# Patient Record
Sex: Female | Born: 1980 | Race: White | Hispanic: No | Marital: Married | Smoking: Former smoker
Health system: Southern US, Community
[De-identification: ages and names within clinical notes are randomized; demographics above are authoritative.]

## PROBLEM LIST (undated history)

## (undated) ENCOUNTER — Inpatient Hospital Stay (HOSPITAL_COMMUNITY): Payer: Self-pay

## (undated) DIAGNOSIS — F3181 Bipolar II disorder: Secondary | ICD-10-CM

## (undated) DIAGNOSIS — G43909 Migraine, unspecified, not intractable, without status migrainosus: Secondary | ICD-10-CM

## (undated) DIAGNOSIS — Z87410 Personal history of cervical dysplasia: Secondary | ICD-10-CM

## (undated) DIAGNOSIS — Z8759 Personal history of other complications of pregnancy, childbirth and the puerperium: Secondary | ICD-10-CM

## (undated) DIAGNOSIS — R87629 Unspecified abnormal cytological findings in specimens from vagina: Secondary | ICD-10-CM

## (undated) DIAGNOSIS — E079 Disorder of thyroid, unspecified: Secondary | ICD-10-CM

## (undated) DIAGNOSIS — Z8669 Personal history of other diseases of the nervous system and sense organs: Secondary | ICD-10-CM

## (undated) DIAGNOSIS — K219 Gastro-esophageal reflux disease without esophagitis: Secondary | ICD-10-CM

## (undated) DIAGNOSIS — O139 Gestational [pregnancy-induced] hypertension without significant proteinuria, unspecified trimester: Secondary | ICD-10-CM

## (undated) DIAGNOSIS — H35389 Toxic maculopathy, unspecified eye: Secondary | ICD-10-CM

## (undated) DIAGNOSIS — Z8489 Family history of other specified conditions: Secondary | ICD-10-CM

## (undated) DIAGNOSIS — E039 Hypothyroidism, unspecified: Secondary | ICD-10-CM

## (undated) DIAGNOSIS — F39 Unspecified mood [affective] disorder: Secondary | ICD-10-CM

## (undated) DIAGNOSIS — Z973 Presence of spectacles and contact lenses: Secondary | ICD-10-CM

## (undated) DIAGNOSIS — K8020 Calculus of gallbladder without cholecystitis without obstruction: Secondary | ICD-10-CM

## (undated) DIAGNOSIS — E282 Polycystic ovarian syndrome: Secondary | ICD-10-CM

## (undated) DIAGNOSIS — R55 Syncope and collapse: Secondary | ICD-10-CM

## (undated) DIAGNOSIS — N946 Dysmenorrhea, unspecified: Secondary | ICD-10-CM

## (undated) DIAGNOSIS — F32A Depression, unspecified: Secondary | ICD-10-CM

## (undated) DIAGNOSIS — I10 Essential (primary) hypertension: Secondary | ICD-10-CM

## (undated) HISTORY — PX: APPENDECTOMY: SHX54

## (undated) HISTORY — DX: Toxic maculopathy, unspecified eye: H35.389

## (undated) HISTORY — DX: Unspecified mood (affective) disorder: F39

## (undated) HISTORY — DX: Depression, unspecified: F32.A

## (undated) HISTORY — DX: Essential (primary) hypertension: I10

## (undated) HISTORY — DX: Disorder of thyroid, unspecified: E07.9

## (undated) HISTORY — DX: Dysmenorrhea, unspecified: N94.6

## (undated) HISTORY — PX: WISDOM TOOTH EXTRACTION: SHX21

## (undated) HISTORY — PX: GYNECOLOGIC CRYOSURGERY: SHX857

## (undated) HISTORY — DX: Gestational (pregnancy-induced) hypertension without significant proteinuria, unspecified trimester: O13.9

## (undated) HISTORY — DX: Unspecified abnormal cytological findings in specimens from vagina: R87.629

## (undated) HISTORY — PX: TONSILLECTOMY AND ADENOIDECTOMY: SUR1326

## (undated) HISTORY — PX: COLPOSCOPY: SHX161

---
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
## 2003-07-16 ENCOUNTER — Other Ambulatory Visit: Admission: RE | Admit: 2003-07-16 | Discharge: 2003-07-16 | Payer: Self-pay | Admitting: Obstetrics and Gynecology

## 2004-04-30 ENCOUNTER — Observation Stay (HOSPITAL_COMMUNITY): Admission: EM | Admit: 2004-04-30 | Discharge: 2004-05-01 | Payer: Self-pay

## 2006-12-05 ENCOUNTER — Other Ambulatory Visit: Admission: RE | Admit: 2006-12-05 | Discharge: 2006-12-05 | Payer: Self-pay | Admitting: Gynecology

## 2010-06-25 NOTE — Op Note (Signed)
[place]. [facility]  Patient:    Melissa Whitney, Melissa Whitney                     MRN: [id number] Proc. Date: 08/15/00 Adm. Date:  [id number] Attending:  Fayette Pho Damodar CC:         Duncan Dull, M.D.   Operative Report  PREOPERATIVE DIAGNOSIS:  Acute appendicitis.  POSTOPERATIVE DIAGNOSIS:  Acute suppurative appendicitis without perforation.  OPERATION PERFORMED:  Exploratory laparotomy and appendectomy.  SURGEON:  Prabhakar D. Levie Heritage, M.D.  ASSISTANT:  Nurse.  ANESTHESIA:  Nurse.  INDICATIONS FOR PROCEDURE:  30 year old girl was admitted with about a 10-hour history of progressively worse intermittent colicky lower abdominal pain associated with bilious vomiting.  There was no history of URI, no dysuria, no abdominal trauma.  Physical examination was consistent with localizing lower quadrant tenderness.  Her white count was 19,200 with shift to the left. Urinalysis was negative.  A CT scan with IV and rectal contrast showed findings consistent with acute appendicitis.  Exploratory laparotomy was planned.  OPERATIVE FINDINGS:  Exploration of the right lower quadrant area showed appendix to be about four inches long with the distal half markedly distended with congestion of the serosa and omentum wrapped around this area consistent with acute suppurative appendicitis without gross perforation.  Examination of the distal ileum showed no evidence of ileitis or Meckels diverticulum.  DESCRIPTION OF PROCEDURE:  Under satisfactory general endotracheal anesthesia, with the patient in supine position, the abdomen was thoroughly prepped and draped in the usual manner.  Over the palpable fullness in the right lower quadrant area, about 4 to 5 cm long transverse incision was made.  The skin and subcutaneous tissues were incised.  Bleeders were individually clamped, cut and electrocoagulated.  Incision carried through the layers of the abdominal wall.  The  peritoneal cavity was entered.  The findings were as described above.  The cecum and appendix were exteriorized.  The appendicular mesentery was serially clamped, cut and ligated with 2-0 silk.  Appendectomy done in the routine fashion.  The stump was buried in the cecal wall with 3-0 silk pursestring suture.  Examination of the distal ileum was carried out. There was no evidence of ileitis or Meckels diverticulum.  The bowel was returned to the peritoneal cavity.  The area was irrigated with saline, sponge and needle count being correct, peritoneum and fascia closed with 2-0 Vicryl running interlocking sutures.  The muscle was closed with 2-0 Vicryl interrupted sutures, subcutaneous tissues with 2-0 Vicryl interrupted sutures. Skin closed with 4-0 Monocryl subcuticular sutures.  Steri-Strips applied. Appropriate dressing applied.  Throughout the procedure, the patients vital signs remained stable.  The patient withstood the procedure well and was transferred to the recovery room in satisfactory general condition. DD:  08/15/00 TD:  08/15/00 Job: [id number]

## 2010-06-25 NOTE — Procedures (Signed)
[place]. [facility]  Patient:    Melissa Whitney, Melissa Whitney                     MRN: [id number] Proc. Date: 04/03/00 Adm. Date:  [id number] Disc. Date: [id number] Attending:  Armanda Magic                           Procedure Report  PROCEDURE:  Tilt table test.  OPERATOR:  Armanda Magic, M.D.  INDICATION:  Syncope.  CLINICAL HISTORY:  This is a very pleasant 30 year old white female with multiple episodes of syncope in the past, who underwent initial tilt table testing, which was positive for vasovagal syncope with profound sinus pause with asystole for eight seconds.  The patient was placed on Paxil 20 mg a day and has had no further syncopal episodes in the past three weeks.  She now presents for repeat tilt table testing.  DESCRIPTION OF PROCEDURE:  The patient was brought to the electrophysiology laboratory in the fasting, nonsedated state.  Informed consent was obtained. The patient was connected to continuous heart rate and pulse oximetry monitoring and intermittent blood pressure monitoring.  The patient was placed in the supine position, and baseline blood pressure was obtained.  Her baseline blood pressure was 111/54 to 129/68.  Baseline heart rate was in the 60s.  The patient was then tilted upright 70 degrees for a total of 30 minutes.  Her lowest blood pressure was 113/85 with a heart rate in the 90s. There was one transient blood pressure of 78/38, but the patient was asymptomatic, and recheck was 113/74.  The patient was then placed supine, and her blood pressure again lowest was 111/70 with pulses in the 60s.  She was started on Isuprel drip, which was titrated to obtain a 20% increase in her resting heart rate.  She was then tilted back up at 70 degrees for a total of 15 minutes.  Her lowest blood pressure achieved was 119/70 with heart rates of 130s.  The patient was then tilted up to 70 degrees on Isuprel drip for a total of 30 minutes.  Lowest  blood pressure achieved was 92/57, and heart rates remained in the 130s-140s.  She had no symptoms.  At the end of the study, she was tilted back down supine, and then resting blood pressure was 134/79 with a heart rate of 112.  She was then transferred back to her room in stable condition.  IMPRESSION: 1. Syncope. 2. History of positive tilt table test with vasovagal syncope and prominent    sinus pauses with asystole for eight seconds. 3. Negative tilt table testing today on Paxil 20 mg a day.  PLAN: 1. Continue Paxil at the current dose. 2. I have instructed her she still cannot drive for two months. 3. She is to follow up with me in one month.DD:  04/05/00 TD:  04/05/00 Job: [id number]

## 2010-06-25 NOTE — Discharge Summary (Signed)
[place]. [facility]  Patient:    Melissa Whitney, Melissa Whitney                     MRN: [id number] Adm. Date:  [id number] Disc. Date: [id number] Attending:  Fayette Pho Damodar Dictator:   Emelda Fear, M.D.                           Discharge Summary  DATE OF BIRTH:  1980-11-07  DISCHARGE DIAGNOSIS:  Appendicitis, status post open appendectomy.  PROCEDURES:  Open appendectomy performed by Prabhakar D. Pendse, M.D.  HISTORY OF PRESENT ILLNESS:  Kassidee is a 30 year old white female who presented to the emergency department with abdominal pain, specifically in the right lower quadrant.  LABORATORY DATA:  At that time, a UA was performed which was within normal limits.  A CBC was drawn which revealed white blood cell count of 19.2, hemoglobin 15.3, hematocrit 43, platelets 445, neutrophils 89%, and lymphocytes 9%.  A CT of the abdomen confirmed acute appendicitis.  An ABG was also performed at this time and revealed a pH of 7.37, a CO2 of 47, and a bicarbonate of 28.  HOSPITAL COURSE:  Prabhakar D. Pendse, M.D., took the patient to surgery and performed an open appendectomy.  The patient tolerated the procedure without difficulty.  The patient had no complications postoperatively and was discharged home on postoperative day #2.  Of note, on postoperative day #0, the patient had two syncopal episodes.  The patient does have a past medical history significant for neurocardiogenic syncope and is followed by Armanda Magic, M.D., here in [place], [place].  She is managed effectively on an outpatient basis on Paxil 20 mg a day for her neurocardiogenic syncope. The patient was placed on telemetry while in house and did not have any further syncopal episodes during her hospitalization.  The patient also admitted to missing three to four doses of her Paxil in the previous week. Postoperatively, the patient advanced her diet without complications and passed  flatus before discharge.  The patient is to follow up with Prabhakar D. Pendse, M.D., 10 days following discharge on Tuesday, August 29, 2000, at 3:30 p.m.  DISCHARGE MEDICATIONS: 1. Percocet one tablet p.o. q.6h. for severe pain. 2. Tylenol 325 mg two tablets by mouth every four to six hours for milder    pain.  ACTIVITY LEVEL:  It is recommended for the patient to resume decreased activity for the next two weeks.  Then she should be able to resume her regular daily activities.  DIET:  Recommend a soft diet for the next one to two days and then resume normal diet.  WOUND CARE:  The patient can take a shower, letting the water run over the incision site.  Allow the Steri-Strips at the site to fall off on their own.  SPECIAL INSTRUCTIONS:  Recommend that the patient call Dr. Donnella Bi D. Pendses for a temperature greater than 100.4 degrees Fahrenheit, increasing pain at the incision site, or development of vomiting.  FOLLOW-UP:  Follow-up appointment with Prabhakar D. Levie Heritage, M.D., on Tuesday, August 29, 2000, at 3:30 p.m. DD:  08/18/00 TD:  08/18/00 Job: [id number]

## 2010-06-25 NOTE — Procedures (Signed)
[place]. [facility]  Patient:    Melissa Whitney, Melissa Whitney                     MRN: [id number] Proc. Date: 03/14/00 Adm. Date:  [id number] Disc. Date: [id number] Attending:  Armanda Magic CC:         Melissa Whitney, M.D.  Duncan Dull, M.D.   Procedure Report  PROCEDURE:  Tilt table test.  INDICATION:  Syncope.  HISTORY:  This is a 30 year old white female with multiple episodes of syncope, some of them associated with seizure activity.  She has had a negative neurologic workup.  She now presents for tilt table testing.  DESCRIPTION OF PROCEDURE:  The patient is brought to the electrophysiology in the fasting, nonsedated state.  Informed consent was obtained.  The patient was connected to continuous heart rate and pulse oximetry monitoring and intermittent blood pressure monitor.  The patients blood pressure was monitored for five minutes in the supine position and ranged from 111/66 to 124/76.  The baseline heart rate was 63-70 beats per minute.  The patient was then tilted upright to up degrees for a total of 21 minutes.  At 21 minutes into the tilt, her blood pressure remained stable at 116/57, but her heart rate dropped from 101 beats per minute to 90 beats per minute.  I then dropped to 40 beats per minute, and the patient became dizzy and then asystolic and unresponsive.  She was immediately tilted back to the supine position with resolution of her heart rate into the 90s.  Her blood pressure at that time was 97/42.  After a few seconds, the patient then again had a syncopal episode, this time with an 8.7 second asystolic pause.  The initial pause during the upright tilt was 4.5 seconds.  She then resumed normal sinus rhythm and a heart rate of 71 beats per minute with a blood pressure of 134/93 and after monitoring for 15 minutes, she was discharged back to her room in stable condition.  IMPRESSION:  Positive tilt table testing for vasovagal  syncope with one long pause and then an episode of asystole lasting 8.5 seconds.  RECOMMENDATIONS:  I had a long discussion with Melissa Whitney parents as well as Melissa Whitney.  She does have vasovagal syncope, which can essentially be treated with SSRIs and beta blockers.  I do not think a beta blocker is safe to use in her, since she has baseline low heart rate in the 60s and given her significant pauses, I would not want to suppress her SA node any further.  I have explained to the parents that it is likely she will need a permanent pacemaker placed, although there is a possibility that the serotonin reuptake inhibitors potentially could resolve the vasovagal syncope.  Melissa Whitney is somewhat worried about getting a pacemaker given her age and really would like to try the serotonin reuptake inhibitors first.  I have told her that she absolutely cannot drive and that what we can do is treat her with Paxil 20 mg a day for three weeks and then bring her back and repeat the tilt table test. If she has any further episodes of pauses or asystole, then she will need to have a permanent pacemaker placed.  I understand her and her parents concerns of putting a pacemaker in a 30 year old, which means that she will have a a long history over the years of requiring battery change-outs and potential complications of lead  extractions from having a pacemaker put in so early, but again, if she has recurrent syncope, she could at some point hurt herself and would not be able to drive if she has not had a pacemaker placed.  At this point, we have opted for treating her with Paxil 20 mg a day.  She will follow up with me in one week to see how she is doing, and then we will set up a repeat tilt table test in three weeks.  If the tilt table again is positive for vasovagal syncope, then she will proceed with permanent pacemaker placement. DD:  03/14/00 TD:  03/15/00 Job: [id number]

## 2011-01-24 ENCOUNTER — Other Ambulatory Visit (HOSPITAL_COMMUNITY)
Admission: RE | Admit: 2011-01-24 | Discharge: 2011-01-24 | Disposition: A | Discharge status: Home / Self Care | Payer: 59 | Source: Ambulatory Visit | Attending: Family Medicine | Admitting: Family Medicine

## 2011-01-24 ENCOUNTER — Other Ambulatory Visit: Payer: Self-pay | Admitting: Family Medicine

## 2011-01-24 DIAGNOSIS — Z124 Encounter for screening for malignant neoplasm of cervix: Secondary | ICD-10-CM | POA: Insufficient documentation

## 2011-01-24 DIAGNOSIS — Z1159 Encounter for screening for other viral diseases: Secondary | ICD-10-CM | POA: Insufficient documentation

## 2012-02-27 ENCOUNTER — Other Ambulatory Visit: Payer: Self-pay | Admitting: Family Medicine

## 2012-02-27 ENCOUNTER — Other Ambulatory Visit (HOSPITAL_COMMUNITY)
Admission: RE | Admit: 2012-02-27 | Discharge: 2012-02-27 | Disposition: A | Discharge status: Home / Self Care | Payer: 59 | Source: Ambulatory Visit | Attending: Family Medicine | Admitting: Family Medicine

## 2012-02-27 DIAGNOSIS — Z124 Encounter for screening for malignant neoplasm of cervix: Secondary | ICD-10-CM | POA: Insufficient documentation

## 2012-12-26 ENCOUNTER — Other Ambulatory Visit: Payer: Self-pay | Admitting: Gynecology

## 2012-12-26 DIAGNOSIS — R102 Pelvic and perineal pain: Secondary | ICD-10-CM

## 2012-12-27 ENCOUNTER — Ambulatory Visit: Payer: Self-pay | Admitting: Gynecology

## 2013-01-02 ENCOUNTER — Other Ambulatory Visit: Payer: Self-pay | Admitting: Gynecology

## 2013-01-02 ENCOUNTER — Encounter: Payer: Self-pay | Admitting: Gynecology

## 2013-01-02 ENCOUNTER — Ambulatory Visit (INDEPENDENT_AMBULATORY_CARE_PROVIDER_SITE_OTHER): Payer: 59

## 2013-01-02 ENCOUNTER — Ambulatory Visit (INDEPENDENT_AMBULATORY_CARE_PROVIDER_SITE_OTHER): Payer: 59 | Admitting: Gynecology

## 2013-01-02 VITALS — BP 120/70 | Ht 67.25 in | Wt 189.0 lb

## 2013-01-02 DIAGNOSIS — N946 Dysmenorrhea, unspecified: Secondary | ICD-10-CM

## 2013-01-02 DIAGNOSIS — N8300 Follicular cyst of ovary, unspecified side: Secondary | ICD-10-CM

## 2013-01-02 DIAGNOSIS — R102 Pelvic and perineal pain: Secondary | ICD-10-CM

## 2013-01-02 DIAGNOSIS — N920 Excessive and frequent menstruation with regular cycle: Secondary | ICD-10-CM

## 2013-01-02 DIAGNOSIS — N949 Unspecified condition associated with female genital organs and menstrual cycle: Secondary | ICD-10-CM

## 2013-01-02 NOTE — Progress Notes (Signed)
    Patient is a 32 year old gravida 0 who was referred for practice as a courtesy of her primary Carilyn Goodpasture, PA-C. Because of patient's dysmenorrhea, menorrhagia and pelvic pressure especially when jogging. Patient states her recent Pap smear and gynecological exam was done this year and all were normal. 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 She is being followed by Dr. Laurene Footman for Hashimoto's thyroiditis.  Patient denies any dyspareunia although she does feel bloated and achy at time of her menses. She is contemplating on getting pregnant in approximately 3 years and is currently using withdrawal as a form of contraception.  Patient's mother and sister both with history of endometriosis.patient reports normal menstrual cycles although they last approximately 5-7 days and heavy with cramping.  Exam: Well-developed well-nourished female in no acute distress Abdomen: Soft nontender no rebound or guarding Back: No CVA tenderness Pelvic: Bartholin urethra Skene glands within normal limits Vagina: No lesions or discharge Cervix: No lesions or discharge Uterus: Anteverted normal size shape and consistency nontender Adnexa: No palpable masses or tenderness Rectal exam: Not done  Assessment/plan: based on patient's symptoms and family history makes one suspicious that her symptoms may be attributed to underlying endometriosis. We discussed indirect treatment before laparoscopic procedure to see if her symptoms would improve. We discussed continuous low-dose oral contraceptive pill but she stated that many years ago she continued to have breakthrough bleeding with that regimen. We discussed also the Mirena IUD which would help not only for her dysmenorrhea and menorrhagia but for endometriosis as well. Patient is interested in proceeding this route. The risks benefits and pros and cons were discussed as  well as literature and information was provided. The discomfort that she experiences during jogging could be from retrograde menstruation or pelvic congestion but she had a totally normal ultrasound. Pelvic exam was totally unremarkable did not elicit any form of pain. She has no evidence of any pelvic organ prolapse this early in her life. If this option does not work and she continues with symptoms we discussed next that would be to proceed with diagnostic laparoscopy. Information on endometriosis as well as on laparoscopy was provided as well.

## 2013-01-02 NOTE — Patient Instructions (Addendum)
Intrauterine Device Information An intrauterine device (IUD) is inserted into your uterus to prevent pregnancy. There are two types of IUDs available:   Copper IUD This type of IUD is wrapped in copper wire and is placed inside the uterus. Copper makes the uterus and fallopian tubes produce a fluid that kills sperm. The copper IUD can stay in place for 10 years.  Hormone IUD This type of IUD contains the hormone progestin (synthetic progesterone). The hormone thickens the cervical mucus and prevents sperm from entering the uterus. It also thins the uterine lining to prevent implantation of a fertilized egg. The hormone can weaken or kill the sperm that get into the uterus. One type of hormone IUD can stay in place for 5 years, and another type can stay in place for 3 years. Your health care provider will make sure you are a good candidate for a contraceptive IUD. Discuss with your health care provider the possible side effects.  ADVANTAGES OF AN INTRAUTERINE DEVICE  IUDs are highly effective, reversible, long acting, and low maintenance.   There are no estrogen-related side effects.   An IUD can be used when breastfeeding.   IUDs are not associated with weight gain.   The copper IUD works immediately after insertion.   The hormone IUD works right away if inserted within 7 days of your period starting. You will need to use a backup method of birth control for 7 days if the hormone IUD is inserted at any other time in your cycle.  The copper IUD does not interfere with your female hormones.   The hormone IUD can make heavy menstrual periods lighter and decrease cramping.   The hormone IUD can be used for 3 or 5 years.   The copper IUD can be used for 10 years. DISADVANTAGES OF AN INTRAUTERINE DEVICE  The hormone IUD can be associated with irregular bleeding patterns.   The copper IUD can make your menstrual flow heavier and more painful.   You may experience cramping and  vaginal bleeding after insertion.  Document Released: 12/29/2003 Document Revised: 09/26/2012 Document Reviewed: 07/15/2012 [facility] Patient Information 2014 [place], [place]. Diagnostic Laparoscopy Laparoscopy is a surgical procedure. It is used to diagnose and treat diseases inside the belly (abdomen). It is usually a brief, common, and relatively simple procedure. The laparoscopeis a thin, lighted, pencil-sized instrument. It is like a telescope. It is inserted into your abdomen through a small cut (incision). Your caregiver can look at the organs inside your body through this instrument. He or she can see if there is anything abnormal. Laparoscopy can be done either in a hospital or outpatient clinic. You may be given a mild sedative to help you relax before the procedure. Once in the operating room, you will be given a drug to make you sleep (general anesthesia). Laparoscopy usually lasts less than 1 hour. After the procedure, you will be monitored in a recovery area until you are stable and doing well. Once you are home, it will take 2 to 3 days to fully recover. RISKS AND COMPLICATIONS  Laparoscopy has relatively few risks. Your caregiver will discuss the risks with you before the procedure. Some problems that can occur include:  Infection.  Bleeding.  Damage to other organs.  Anesthetic side effects. PROCEDURE Once you receive anesthesia, your surgeon inflates the abdomen with a harmless gas (carbon dioxide). This makes the organs easier to see. The laparoscope is inserted into the abdomen through a small incision. This allows your surgeon  to see into the abdomen. Other small instruments are also inserted into the abdomen through other small openings. Many surgeons attach a video camera to the laparoscope to enlarge the view. During a diagnostic laparoscopy, the surgeon may be looking for inflammation, infection, or cancer. Your surgeon may take tissue samples(biopsies). The samples are  sent to a specialist in looking at cells and tissue samples (pathologist). The pathologist examines them under a microscope. Biopsies can help to diagnose or confirm a disease. AFTER THE PROCEDURE   The gas is released from inside the abdomen.  The incisions are closed with stitches (sutures). Because these incisions are small (usually less than 1/2 inch), there is usually minimal discomfort after the procedure. There may be some mild discomfort in the throat. This is from the tube placed in the throat while you were sleeping. You may have some mild abdominal discomfort. There may also be discomfort from the instrument placement incisions in the abdomen.  The recovery time is shortened as long as there are no complications.  You will rest in a recovery room until stable and doing well. As long as there are no complications, you may be allowed to go home. FINDING OUT THE RESULTS OF YOUR TEST Not all test results are available during your visit. If your test results are not back during the visit, make an appointment with your caregiver to find out the results. Do not assume everything is normal if you have not heard from your caregiver or the medical facility. It is important for you to follow up on all of your test results. HOME CARE INSTRUCTIONS   Take all medicines as directed.  Only take over-the-counter or prescription medicines for pain, discomfort, or fever as directed by your caregiver.  Resume daily activities as directed.  Showers are preferred over baths.  You may resume sexual activities in 1 week or as directed.  Do not drive while taking narcotics. SEEK MEDICAL CARE IF:   There is increasing abdominal pain.  There is new pain in the shoulders (shoulder strap areas).  You feel lightheaded or faint.  You have the chills.  You or your child has an oral temperature above 102 F (38.9 C).  There is pus-like (purulent) drainage from any of the wounds.  You are unable to  pass gas or have a bowel movement.  You feel sick to your stomach (nauseous) or throw up (vomit). MAKE SURE YOU:   Understand these instructions.  Will watch your condition.  Will get help right away if you are not doing well or get worse. Document Released: 05/02/2000 Document Revised: 05/21/2012 Document Reviewed: 01/24/2007 [facility] Patient Information 2014 [place], [place]. Endometriosis Endometriosis is a disease that occurs when the endometrium (lining of the uterus) is misplaced outside of its normal location. It may occur in many locations close to the uterus (womb), but commonly on the ovaries, fallopian tubes, vagina (birth canal) and bowel located close to the uterus. Because the uterus sloughs (expels) its lining every month (menses), there is bleeding whereever the endometrial tissue is located. SYMPTOMS  Often there are no symptoms. However, because blood is irritating to tissues not normally exposed to it, when symptoms occur they vary with the location of the misplaced endometrium. Symptoms often include back and abdominal pain. Periods may be heavier and intercourse may be painful. Infertility may be present. You may have all of these symptoms at one time or another or you may have months with no symptoms at all. Although the  symptoms occur mainly during menses, they can occur mid-cycle as well, and usually terminate with menopause. DIAGNOSIS  Your caregiver may recommend a blood test and urine test (urinalysis) to help rule out other conditions. Another common test is ultrasound, a painless procedure that uses sound waves to make a sonogram "picture" of abnormal tissue that could be endometriosis. If your bowel movements are painful around your periods, your caregiver may advise a barium enema (an X-ray of the lower bowel), to try to find the source of your pain. This is sometimes confirmed by laparoscopy. Laparoscopy is a procedure where your caregiver looks into your abdomen with  a laparoscope (a small pencil sized telescope). Your caregiver may take a tiny piece of tissue (biopsy) from any abnormal tissue to confirm or document your problem. These tissues are sent to the lab and a pathologist looks at them under the microscope to give a microscopic diagnosis. TREATMENT  Once the diagnosis is made, it can be treated by destruction of the misplaced endometrial tissue using heat (diathermy), laser, cutting (excision), or chemical means. It may also be treated with hormonal therapy. When using hormonal therapy menses are eliminated, therefore eliminating the monthly exposure to blood by the misplaced endometrial tissue. Only in severe cases is it necessary to perform a hysterectomy with removal of the tubes, uterus and ovaries. HOME CARE INSTRUCTIONS   Only take over-the-counter or prescription medicines for pain, discomfort, or fever as directed by your caregiver.  Avoid activities that produce pain, including a physical sexual relationship.  Do not take aspirin as this may increase bleeding when not on hormonal therapy.  See your caregiver for pain or problems not controlled with treatment. SEEK IMMEDIATE MEDICAL CARE IF:   Your pain is severe and is not responding to pain medicine.  You develop severe nausea and vomiting, or you cannot keep foods down.  Your pain localizes to the right lower part of your abdomen (possible appendicitis).  You have swelling or increasing pain in the abdomen.  You have a fever.  You see blood in your stool. MAKE SURE YOU:   Understand these instructions.  Will watch your condition.  Will get help right away if you are not doing well or get worse. Document Released: 01/22/2000 Document Revised: 04/18/2011 Document Reviewed: 09/21/2012 [facility] Patient Information 2014 [place], [place].

## 2013-01-10 ENCOUNTER — Telehealth: Payer: Self-pay | Admitting: Gynecology

## 2013-01-10 NOTE — Telephone Encounter (Signed)
01/10/13-LM VM to advise pt that her ins covers the MIrena & insertion at 100%,no copay. She was instructed to call first day of cycle for insertion/WL

## 2013-03-01 ENCOUNTER — Ambulatory Visit (INDEPENDENT_AMBULATORY_CARE_PROVIDER_SITE_OTHER): Payer: 59 | Admitting: Gynecology

## 2013-03-01 ENCOUNTER — Telehealth: Payer: Self-pay | Admitting: Gynecology

## 2013-03-01 ENCOUNTER — Encounter: Payer: Self-pay | Admitting: Gynecology

## 2013-03-01 VITALS — BP 130/88

## 2013-03-01 DIAGNOSIS — Z975 Presence of (intrauterine) contraceptive device: Secondary | ICD-10-CM | POA: Insufficient documentation

## 2013-03-01 DIAGNOSIS — Z30430 Encounter for insertion of intrauterine contraceptive device: Secondary | ICD-10-CM

## 2013-03-01 NOTE — Patient Instructions (Signed)
Levonorgestrel intrauterine device (IUD) What is this medicine? LEVONORGESTREL IUD (LEE voe nor jes trel) is a contraceptive (birth control) device. The device is placed inside the uterus by a healthcare professional. It is used to prevent pregnancy and can also be used to treat heavy bleeding that occurs during your period. Depending on the device, it can be used for 3 to 5 years. This medicine may be used for other purposes; ask your health care provider or pharmacist if you have questions. COMMON BRAND NAME(S): Mirena, Skyla What should I tell my health care provider before I take this medicine? They need to know if you have any of these conditions: -abnormal Pap smear -cancer of the breast, uterus, or cervix -diabetes -endometritis -genital or pelvic infection now or in the past -have more than one sexual partner or your partner has more than one partner -heart disease -history of an ectopic or tubal pregnancy -immune system problems -IUD in place -liver disease or tumor -problems with blood clots or take blood-thinners -use intravenous drugs -uterus of unusual shape -vaginal bleeding that has not been explained -an unusual or allergic reaction to levonorgestrel, other hormones, silicone, or polyethylene, medicines, foods, dyes, or preservatives -pregnant or trying to get pregnant -breast-feeding How should I use this medicine? This device is placed inside the uterus by a health care professional. Talk to your pediatrician regarding the use of this medicine in children. Special care may be needed. Overdosage: If you think you have taken too much of this medicine contact a poison control center or emergency room at once. NOTE: This medicine is only for you. Do not share this medicine with others. What if I miss a dose? This does not apply. What may interact with this medicine? Do not take this medicine with any of the following  medications: -amprenavir -bosentan -fosamprenavir This medicine may also interact with the following medications: -aprepitant -barbiturate medicines for inducing sleep or treating seizures -bexarotene -griseofulvin -medicines to treat seizures like carbamazepine, ethotoin, felbamate, oxcarbazepine, phenytoin, topiramate -modafinil -pioglitazone -rifabutin -rifampin -rifapentine -some medicines to treat HIV infection like atazanavir, indinavir, lopinavir, nelfinavir, tipranavir, ritonavir -St. John's wort -warfarin This list may not describe all possible interactions. Give your health care provider a list of all the medicines, herbs, non-prescription drugs, or dietary supplements you use. Also tell them if you smoke, drink alcohol, or use illegal drugs. Some items may interact with your medicine. What should I watch for while using this medicine? Visit your doctor or health care professional for regular check ups. See your doctor if you or your partner has sexual contact with others, becomes HIV positive, or gets a sexual transmitted disease. This product does not protect you against HIV infection (AIDS) or other sexually transmitted diseases. You can check the placement of the IUD yourself by reaching up to the top of your vagina with clean fingers to feel the threads. Do not pull on the threads. It is a good habit to check placement after each menstrual period. Call your doctor right away if you feel more of the IUD than just the threads or if you cannot feel the threads at all. The IUD may come out by itself. You may become pregnant if the device comes out. If you notice that the IUD has come out use a backup birth control method like condoms and call your health care provider. Using tampons will not change the position of the IUD and are okay to use during your period. What side effects may I   notice from receiving this medicine? Side effects that you should report to your doctor or  health care professional as soon as possible: -allergic reactions like skin rash, itching or hives, swelling of the face, lips, or tongue -fever, flu-like symptoms -genital sores -high blood pressure -no menstrual period for 6 weeks during use -pain, swelling, warmth in the leg -pelvic pain or tenderness -severe or sudden headache -signs of pregnancy -stomach cramping -sudden shortness of breath -trouble with balance, talking, or walking -unusual vaginal bleeding, discharge -yellowing of the eyes or skin Side effects that usually do not require medical attention (report to your doctor or health care professional if they continue or are bothersome): -acne -breast pain -change in sex drive or performance -changes in weight -cramping, dizziness, or faintness while the device is being inserted -headache -irregular menstrual bleeding within first 3 to 6 months of use -nausea This list may not describe all possible side effects. Call your doctor for medical advice about side effects. You may report side effects to FDA at 1-800-FDA-1088. Where should I keep my medicine? This does not apply. NOTE: This sheet is a summary. It may not cover all possible information. If you have questions about this medicine, talk to your doctor, pharmacist, or health care provider.  2014, [employer]. (2011-02-24 13:54:04)  

## 2013-03-01 NOTE — Telephone Encounter (Signed)
02/28/13-I rechecked pt IUD benefits for this year and [employer] said it is still covered at 100%-orig benefits checked 12/14.wl

## 2013-03-01 NOTE — Progress Notes (Signed)
                                                                     IUD procedure note       Patient presented to the office today for placement of  Mirena IUD. The patient had previously been provided with literature information on this method of contraception. The risks benefits and pros and cons were discussed and all her questions were answered. She is fully aware that this form of contraception is 99% effective and is good for 5 years.  Pelvic exam: Bartholin urethra Skene glands: Within normal limits Vagina: No lesions or discharge Cervix: No lesions or discharge Uterus: Anteverted position Adnexa: No masses or tenderness Rectal exam: Not done  The cervix was cleansed with Betadine solution. A single-tooth tenaculum was placed on the anterior cervical lip. The uterus sounded to 7 centimeter. The IUD was shown to the patient and inserted in a sterile fashion. The IUD string was trimmed. The single-tooth tenaculum was removed. Patient was instructed to return back to the office in one month for follow up.

## 2013-03-04 ENCOUNTER — Telehealth: Payer: Self-pay | Admitting: *Deleted

## 2013-03-04 NOTE — Telephone Encounter (Signed)
Pt called asking if normal to having some slight cramping from IUD placement on 03/01/13. I informed pt no unusual to have cramping, pt will take OTC to help. Pt will follow up if needed.

## 2013-03-05 ENCOUNTER — Encounter: Payer: Self-pay | Admitting: Gynecology

## 2013-03-29 ENCOUNTER — Ambulatory Visit: Payer: 59 | Admitting: Gynecology

## 2013-04-08 ENCOUNTER — Ambulatory Visit: Payer: 59 | Admitting: Gynecology

## 2013-04-16 ENCOUNTER — Encounter: Payer: Self-pay | Admitting: Gynecology

## 2013-04-16 ENCOUNTER — Ambulatory Visit (INDEPENDENT_AMBULATORY_CARE_PROVIDER_SITE_OTHER): Payer: 59 | Admitting: Gynecology

## 2013-04-16 VITALS — BP 128/88

## 2013-04-16 DIAGNOSIS — N921 Excessive and frequent menstruation with irregular cycle: Secondary | ICD-10-CM

## 2013-04-16 DIAGNOSIS — Z975 Presence of (intrauterine) contraceptive device: Secondary | ICD-10-CM

## 2013-04-16 DIAGNOSIS — N719 Inflammatory disease of uterus, unspecified: Secondary | ICD-10-CM

## 2013-04-16 DIAGNOSIS — F17200 Nicotine dependence, unspecified, uncomplicated: Secondary | ICD-10-CM

## 2013-04-16 DIAGNOSIS — Z30431 Encounter for routine checking of intrauterine contraceptive device: Secondary | ICD-10-CM

## 2013-04-16 MED ORDER — VARENICLINE TARTRATE 0.5 MG PO TABS: ORAL_TABLET | ORAL | Status: DC

## 2013-04-16 MED ORDER — DOXYCYCLINE HYCLATE 100 MG PO CAPS: 100.0000 mg | ORAL_CAPSULE | Freq: Two times a day (BID) | ORAL | Status: DC

## 2013-04-16 MED ORDER — ESTRADIOL 0.5 MG PO TABS: ORAL_TABLET | ORAL | Status: DC

## 2013-04-16 NOTE — Progress Notes (Signed)
   33 year old who presented to the office today for followup after having placed a Mirena IUD approximately 6 weeks ago. Patient continued to complain of spotting since she had the Mirena IUD placed. Patient also smokes approximately a pack cigarette per day and was interested in starting Chantix .  Exam: Abdomen: Soft nontender no rebound or guarding Pelvic: And urethra Skene was within normal limits Vagina: Dark brown blood was present in the vaginal vault Cervix: IUD string visualized Uterus: Anteverted normal size shape and consistency Adnexa: No palpable masses or tenderness Rectal exam: Not done  Assessment/plan: Patient 6 weeks status post placement ring IUD in the event of a mild endometritis she will be placed on Carlena Hurl 100 mg twice a day for 7 days. She will also be placed on Estrace oral tablet 0.05 mg daily for 21 days to build up her endometrium. Patient also was prescribed Chantix for smoking cessation.

## 2013-04-16 NOTE — Patient Instructions (Signed)
Smoking Cessation Quitting smoking is important to your health and has many advantages. However, it is not always easy to quit since nicotine is a very addictive drug. Often times, people try 3 times or more before being able to quit. This document explains the best ways for you to prepare to quit smoking. Quitting takes hard work and a lot of effort, but you can do it. ADVANTAGES OF QUITTING SMOKING  You will live longer, feel better, and live better.  Your body will feel the impact of quitting smoking almost immediately.  Within 20 minutes, blood pressure decreases. Your pulse returns to its normal level.  After 8 hours, carbon monoxide levels in the blood return to normal. Your oxygen level increases.  After 24 hours, the chance of having a heart attack starts to decrease. Your breath, hair, and body stop smelling like smoke.  After 48 hours, damaged nerve endings begin to recover. Your sense of taste and smell improve.  After 72 hours, the body is virtually free of nicotine. Your bronchial tubes relax and breathing becomes easier.  After 2 to 12 weeks, lungs can hold more air. Exercise becomes easier and circulation improves.  The risk of having a heart attack, stroke, cancer, or lung disease is greatly reduced.  After 1 year, the risk of coronary heart disease is cut in half.  After 5 years, the risk of stroke falls to the same as a nonsmoker.  After 10 years, the risk of lung cancer is cut in half and the risk of other cancers decreases significantly.  After 15 years, the risk of coronary heart disease drops, usually to the level of a nonsmoker.  If you are pregnant, quitting smoking will improve your chances of having a healthy baby.  The people you live with, especially any children, will be healthier.  You will have extra money to spend on things other than cigarettes. QUESTIONS TO THINK ABOUT BEFORE ATTEMPTING TO QUIT You may want to talk about your answers with your  caregiver.  Why do you want to quit?  If you tried to quit in the past, what helped and what did not?  What will be the most difficult situations for you after you quit? How will you plan to handle them?  Who can help you through the tough times? Your family? Friends? A caregiver?  What pleasures do you get from smoking? What ways can you still get pleasure if you quit? Here are some questions to ask your caregiver:  How can you help me to be successful at quitting?  What medicine do you think would be best for me and how should I take it?  What should I do if I need more help?  What is smoking withdrawal like? How can I get information on withdrawal? GET READY  Set a quit date.  Change your environment by getting rid of all cigarettes, ashtrays, matches, and lighters in your home, car, or work. Do not let people smoke in your home.  Review your past attempts to quit. Think about what worked and what did not. GET SUPPORT AND ENCOURAGEMENT You have a better chance of being successful if you have help. You can get support in many ways.  Tell your family, friends, and co-workers that you are going to quit and need their support. Ask them not to smoke around you.  Get individual, group, or telephone counseling and support. Programs are available at local hospitals and health centers. Call your local health department for   information about programs in your area.  Spiritual beliefs and practices may help some smokers quit.  Download a "quit meter" on your computer to keep track of quit statistics, such as how long you have gone without smoking, cigarettes not smoked, and money saved.  Get a self-help book about quitting smoking and staying off of tobacco. LEARN NEW SKILLS AND BEHAVIORS  Distract yourself from urges to smoke. Talk to someone, go for a walk, or occupy your time with a task.  Change your normal routine. Take a different route to work. Drink tea instead of coffee.  Eat breakfast in a different place.  Reduce your stress. Take a hot bath, exercise, or read a book.  Plan something enjoyable to do every day. Reward yourself for not smoking.  Explore interactive web-based programs that specialize in helping you quit. GET MEDICINE AND USE IT CORRECTLY Medicines can help you stop smoking and decrease the urge to smoke. Combining medicine with the above behavioral methods and support can greatly increase your chances of successfully quitting smoking.  Nicotine replacement therapy helps deliver nicotine to your body without the negative effects and risks of smoking. Nicotine replacement therapy includes nicotine gum, lozenges, inhalers, nasal sprays, and skin patches. Some may be available over-the-counter and others require a prescription.  Antidepressant medicine helps people abstain from smoking, but how this works is unknown. This medicine is available by prescription.  Nicotinic receptor partial agonist medicine simulates the effect of nicotine in your brain. This medicine is available by prescription. Ask your caregiver for advice about which medicines to use and how to use them based on your health history. Your caregiver will tell you what side effects to look out for if you choose to be on a medicine or therapy. Carefully read the information on the package. Do not use any other product containing nicotine while using a nicotine replacement product.  RELAPSE OR DIFFICULT SITUATIONS Most relapses occur within the first 3 months after quitting. Do not be discouraged if you start smoking again. Remember, most people try several times before finally quitting. You may have symptoms of withdrawal because your body is used to nicotine. You may crave cigarettes, be irritable, feel very hungry, cough often, get headaches, or have difficulty concentrating. The withdrawal symptoms are only temporary. They are strongest when you first quit, but they will go away within  10 14 days. To reduce the chances of relapse, try to:  Avoid drinking alcohol. Drinking lowers your chances of successfully quitting.  Reduce the amount of caffeine you consume. Once you quit smoking, the amount of caffeine in your body increases and can give you symptoms, such as a rapid heartbeat, sweating, and anxiety.  Avoid smokers because they can make you want to smoke.  Do not let weight gain distract you. Many smokers will gain weight when they quit, usually less than 10 pounds. Eat a healthy diet and stay active. You can always lose the weight gained after you quit.  Find ways to improve your mood other than smoking. FOR MORE INFORMATION  www.smokefree.gov  Document Released: 01/18/2001 Document Revised: 07/26/2011 Document Reviewed: 05/05/2011 [facility] Patient Information 2014 [employer], [place]. Varenicline oral tablets What is this medicine? VARENICLINE (var EN i kleen) is used to help people quit smoking. It can reduce the symptoms caused by stopping smoking. It is used with a patient support program recommended by your physician. This medicine may be used for other purposes; ask your health care provider or pharmacist if you have  questions. COMMON BRAND NAME(S): Chantix What should I tell my health care provider before I take this medicine? They need to know if you have any of these conditions: -bipolar disorder, depression, schizophrenia or other mental illness -heart disease -if you often drink alcohol -kidney disease -peripheral vascular disease -seizures -stroke -suicidal thoughts, plans, or attempt; a previous suicide attempt by you or a family member -an unusual or allergic reaction to varenicline, other medicines, foods, dyes, or preservatives -pregnant or trying to get pregnant -breast-feeding How should I use this medicine? You should set a date to stop smoking and tell your doctor. Start this medicine one week before the quit date. You can also start taking  this medicine before you choose a quit date, and then pick a quit date that is between 8 and 35 days of treatment with this medicine. Stick to your plan; ask about support groups or other ways to help you remain a 'quitter'. Take this medicine by mouth after eating. Take with a full glass of water. Follow the directions on the prescription label. Take your doses at regular intervals. Do not take your medicine more often than directed. A special MedGuide will be given to you by the pharmacist with each prescription and refill. Be sure to read this information carefully each time. Talk to your pediatrician regarding the use of this medicine in children. This medicine is not approved for use in children. Overdosage: If you think you have taken too much of this medicine contact a poison control center or emergency room at once. NOTE: This medicine is only for you. Do not share this medicine with others. What if I miss a dose? If you miss a dose, take it as soon as you can. If it is almost time for your next dose, take only that dose. Do not take double or extra doses. What may interact with this medicine? -alcohol or any product that contains alcohol -insulin -other stop smoking aids -theophylline -warfarin This list may not describe all possible interactions. Give your health care provider a list of all the medicines, herbs, non-prescription drugs, or dietary supplements you use. Also tell them if you smoke, drink alcohol, or use illegal drugs. Some items may interact with your medicine. What should I watch for while using this medicine? Visit your doctor or health care professional for regular check ups. Ask for ongoing advice and encouragement from your doctor or healthcare professional, friends, and family to help you quit. If you smoke while on this medication, quit again Your mouth may get dry. Chewing sugarless gum or sucking hard candy, and drinking plenty of water may help. Contact your doctor  if the problem does not go away or is severe. You may get drowsy or dizzy. Do not drive, use machinery, or do anything that needs mental alertness until you know how this medicine affects you. Do not stand or sit up quickly, especially if you are an older patient. This reduces the risk of dizzy or fainting spells. The use of this medicine may increase the chance of suicidal thoughts or actions. Pay special attention to how you are responding while on this medicine. Any worsening of mood, or thoughts of suicide or dying should be reported to your health care professional right away. What side effects may I notice from receiving this medicine? Side effects that you should report to your doctor or health care professional as soon as possible: -allergic reactions like skin rash, itching or hives, swelling of the face, lips,  tongue, or throat -breathing problems -changes in vision -chest pain or chest tightness -confusion, trouble speaking or understanding -fast, irregular heartbeat -feeling faint or lightheaded, falls -fever -pain in legs when walking -problems with balance, talking, walking -redness, blistering, peeling or loosening of the skin, including inside the mouth -ringing in ears -seizures -sudden numbness or weakness of the face, arm or leg -suicidal thoughts or other mood changes -trouble passing urine or change in the amount of urine -unusual bleeding or bruising -unusually weak or tired Side effects that usually do not require medical attention (report to your doctor or health care professional if they continue or are bothersome): -constipation -headache -nausea, vomiting -strange dreams -stomach gas -trouble sleeping This list may not describe all possible side effects. Call your doctor for medical advice about side effects. You may report side effects to FDA at 1-800-FDA-1088. Where should I keep my medicine? Keep out of the reach of children. Store at room temperature  between 15 and 30 degrees C (59 and 86 degrees F). Throw away any unused medicine after the expiration date. NOTE: This sheet is a summary. It may not cover all possible information. If you have questions about this medicine, talk to your doctor, pharmacist, or health care provider.  2014, Elsevier/Gold Standard. (2012-11-05 13:37:47)

## 2014-09-05 ENCOUNTER — Encounter: Payer: Self-pay | Admitting: Skilled Nursing Facility1

## 2014-09-05 ENCOUNTER — Encounter: Payer: 59 | Attending: Family Medicine | Admitting: Skilled Nursing Facility1

## 2014-09-05 VITALS — Ht 67.0 in | Wt 204.0 lb

## 2014-09-05 DIAGNOSIS — E669 Obesity, unspecified: Secondary | ICD-10-CM | POA: Diagnosis present

## 2014-09-05 DIAGNOSIS — Z6831 Body mass index (BMI) 31.0-31.9, adult: Secondary | ICD-10-CM | POA: Insufficient documentation

## 2014-09-05 DIAGNOSIS — Z713 Dietary counseling and surveillance: Secondary | ICD-10-CM | POA: Insufficient documentation

## 2014-09-05 NOTE — Progress Notes (Signed)
  Medical Nutrition Therapy:  Appt start time: 1100 end time:  1200.   Assessment:  Primary concerns today: referred for obesity. Pt states she is obese and wants to lose wt. Pt states she skips meals to lose wt, wt watchers repeatedly, and my fitness pal. Pt was on phentermine in 2005-lost 40 pound for an entire year.  Pt states she Smokes 15 cigarettes a day. 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  Preferred Learning Style:   Auditory  Learning Readiness:   Ready  MEDICATIONS: See List   DIETARY INTAKE:  Usual eating pattern includes 3 meals and 2 snacks per day.  Everyday foods include none stated.  Avoided foods include none stated.    24-hr recall:  B ( AM): fruit and almond milk sometimes protein powder------greek yogurt with lemon curd Snk ( AM): none L ( PM): panera bread----none Snk ( PM): chex mix D ( PM): meat, vegetables, starch Snk ( PM): ice cream----cake----lemon curd Beverages: water, diet coke, coffee, 1-2 glasses of wine  Usual physical activity: 3 days a week-run 40 minutes; yoga the rest of the week  Estimated energy needs: 1800 calories 200 g carbohydrates 135 g protein 50 g fat  Progress Towards Goal(s):  In progress.   Nutritional Diagnosis:  NB-1.1 Food and nutrition-related knowledge deficit As related to no prior nutrition education from a nutrition professional.  As evidenced by pt report, 24 hr recall, BMI 31.95.    Intervention:  Nutrition counseling for obesity. Dietitian educated the pt on a varied/balanced diet, increased physical activity, balancing the gut microbiome, legitimate/evidenced based resources for nutrition information and eating three meals a day 2-3 snacks a day.   Teaching Method Utilized:  Auditory  Demonstrated degree of understanding via:  Teach Back   Monitoring/Evaluation:  Dietary intake, exercise, and body weight prn.

## 2015-05-20 ENCOUNTER — Ambulatory Visit (INDEPENDENT_AMBULATORY_CARE_PROVIDER_SITE_OTHER): Payer: 59 | Admitting: Urgent Care

## 2015-05-20 VITALS — BP 119/85 | HR 77 | Temp 97.8°F | Resp 18 | Ht 67.0 in | Wt 201.2 lb

## 2015-05-20 DIAGNOSIS — R519 Headache, unspecified: Secondary | ICD-10-CM

## 2015-05-20 DIAGNOSIS — R51 Headache: Secondary | ICD-10-CM | POA: Diagnosis not present

## 2015-05-20 DIAGNOSIS — J029 Acute pharyngitis, unspecified: Secondary | ICD-10-CM | POA: Diagnosis not present

## 2015-05-20 DIAGNOSIS — R52 Pain, unspecified: Secondary | ICD-10-CM

## 2015-05-20 MED ORDER — HYDROCOD POLST-CPM POLST ER 10-8 MG/5ML PO SUER: 5.0000 mL | Freq: Every evening | ORAL | Status: DC | PRN

## 2015-05-20 MED ORDER — OSELTAMIVIR PHOSPHATE 75 MG PO CAPS: 75.0000 mg | ORAL_CAPSULE | Freq: Two times a day (BID) | ORAL | Status: DC

## 2015-05-20 NOTE — Patient Instructions (Addendum)
Influenza, Adult Influenza ("the flu") is a viral infection of the respiratory tract. It occurs more often in winter months because people spend more time in close contact with one another. Influenza can make you feel very sick. Influenza easily spreads from person to person (contagious). CAUSES  Influenza is caused by a virus that infects the respiratory tract. You can catch the virus by breathing in droplets from an infected person's cough or sneeze. You can also catch the virus by touching something that was recently contaminated with the virus and then touching your mouth, nose, or eyes. RISKS AND COMPLICATIONS You may be at risk for a more severe case of influenza if you smoke cigarettes, have diabetes, have chronic heart disease (such as heart failure) or lung disease (such as asthma), or if you have a weakened immune system. Elderly people and pregnant women are also at risk for more serious infections. The most common problem of influenza is a lung infection (pneumonia). Sometimes, this problem can require emergency medical care and may be life threatening. SIGNS AND SYMPTOMS  Symptoms typically last 4 to 10 days and may include:  Fever.  Chills.  Headache, body aches, and muscle aches.  Sore throat.  Chest discomfort and cough.  Poor appetite.  Weakness or feeling tired.  Dizziness.  Nausea or vomiting. DIAGNOSIS  Diagnosis of influenza is often made based on your history and a physical exam. A nose or throat swab test can be done to confirm the diagnosis. TREATMENT  In mild cases, influenza goes away on its own. Treatment is directed at relieving symptoms. For more severe cases, your health care provider may prescribe antiviral medicines to shorten the sickness. Antibiotic medicines are not effective because the infection is caused by a virus, not by bacteria. HOME CARE INSTRUCTIONS  Take medicines only as directed by your health care provider.  Use a cool mist humidifier  to make breathing easier.  Get plenty of rest until your temperature returns to normal. This usually takes 3 to 4 days.  Drink enough fluid to keep your urine clear or pale yellow.  Cover yourmouth and nosewhen coughing or sneezing,and wash your handswellto prevent thevirusfrom spreading.  Stay homefromwork orschool untilthe fever is gonefor at least 70full day. PREVENTION  An annual influenza vaccination (flu shot) is the best way to avoid getting influenza. An annual flu shot is now routinely recommended for all adults in the [place] IF:  You experiencechest pain, yourcough worsens,or you producemore mucus.  Youhave nausea,vomiting, ordiarrhea.  Your fever returns or gets worse. SEEK IMMEDIATE MEDICAL CARE IF:  You havetrouble breathing, you become short of breath,or your skin ornails becomebluish.  You have severe painor stiffnessin the neck.  You develop a sudden headache, or pain in the face or ear.  You have nausea or vomiting that you cannot control. MAKE SURE YOU:   Understand these instructions.  Will watch your condition.  Will get help right away if you are not doing well or get worse.   This information is not intended to replace advice given to you by your health care provider. Make sure you discuss any questions you have with your health care provider.   Document Released: 01/22/2000 Document Revised: 02/14/2014 Document Reviewed: 04/25/2011 Elsevier Interactive Patient Education 2016 [employer].     IF you received an x-ray today, you will receive an invoice from [facility]. Please contact [facility] at [phone] with questions or concerns regarding your invoice.   IF you  received labwork today, you will receive an invoice from [employer]. Please contact [facility] at [phone] with questions or concerns regarding your invoice.   Our billing staff will not be  able to assist you with questions regarding bills from these companies.  You will be contacted with the lab results as soon as they are available. The fastest way to get your results is to activate your My Chart account. Instructions are located on the last page of this paperwork. If you have not heard from [place] regarding the results in 2 weeks, please contact this office.

## 2015-05-20 NOTE — Progress Notes (Signed)
    MRN: [id number] DOB: 30-Jan-1981  Subjective:   Melissa Whitney is a 35 y.o. female presenting for chief complaint of Sore Throat; Generalized Body Aches; and Headache  Reports onset today of headache, malaise, body aches, sore throat, nausea. Has had multiple contacts with the flu. Has not tried any medications for relief. Denies sinus pain, sinus congestion, ear pain, cough, chest pain, shob, vomiting, belly pain. Admits history of seasonal allergies, relieved with Zyrtec. Smokes ~1ppd for 17 years, 3-4 drinks of alcohol per week.  Melissa Whitney has a current medication list which includes the following prescription(s): cetirizine, ibuprofen, levonorgestrel, levothyroxine, and phentermine. Also has No Known Allergies.  Melissa Whitney  has a past medical history of Dysmenorrhea and Thyroid disease. Also  has past surgical history that includes Appendectomy; Tonsillectomy and adenoidectomy; Colposcopy; and Gynecologic cryosurgery.  Her family history includes Endometriosis in her mother and sister.   Objective:   Vitals: BP 119/85 mmHg  Pulse 77  Temp(Src) 97.8 F (36.6 C) (Oral)  Resp 18  Ht 5\' 7"  (1.702 m)  Wt 201 lb 3.2 oz (91.264 kg)  BMI 31.51 kg/m2  SpO2 98%  Physical Exam  Constitutional: She is oriented to person, place, and time. She appears well-developed and well-nourished.  HENT:  TM's intact bilaterally, no effusions or erythema. Nasal turbinates with slight erythema but nasal passages patent. No sinus tenderness. Slight post-nasal drainage, mucous membranes moist, dentition in good repair.  Eyes: Right eye exhibits no discharge. Left eye exhibits no discharge. No scleral icterus.  Neck: Normal range of motion. Neck supple.  Cardiovascular: Normal rate, regular rhythm and intact distal pulses.  Exam reveals no gallop and no friction rub.   No murmur heard. Pulmonary/Chest: No respiratory distress. She has no wheezes. She has no rales.  Lymphadenopathy:    She has no cervical  adenopathy.  Neurological: She is alert and oriented to person, place, and time.  Skin: Skin is warm and dry.   Assessment and Plan :   1. Body aches 2. Sore throat 3. Headache, unspecified headache type - Clinical diagnosis of the flu. Patient agreed not to do flu swab, cbc. Will start empiric treatment with Tamiflu. RTC if not better by Sunday. I advised that if her husband becomes symptomatic, it is okay to fill script for Tamiflu for him as well.  Jaynee Eagles, PA-C [facility] [phone] 05/20/2015 4:34 PM

## 2015-05-22 NOTE — Progress Notes (Signed)
Sounds like flu to me. I usually still do a strep swab to be sure we don't miss a strep throat. Thanks. Richardson Landry

## 2017-03-01 ENCOUNTER — Encounter: Payer: Self-pay | Admitting: Women's Health

## 2017-03-01 ENCOUNTER — Ambulatory Visit: Payer: 59 | Admitting: Women's Health

## 2017-03-01 VITALS — BP 138/80 | Wt 191.0 lb

## 2017-03-01 DIAGNOSIS — B9689 Other specified bacterial agents as the cause of diseases classified elsewhere: Secondary | ICD-10-CM

## 2017-03-01 DIAGNOSIS — N898 Other specified noninflammatory disorders of vagina: Secondary | ICD-10-CM | POA: Diagnosis not present

## 2017-03-01 DIAGNOSIS — N760 Acute vaginitis: Secondary | ICD-10-CM | POA: Diagnosis not present

## 2017-03-01 LAB — WET PREP FOR TRICH, YEAST, CLUE

## 2017-03-01 MED ORDER — METRONIDAZOLE 500 MG PO TABS: 500.0000 mg | ORAL_TABLET | Freq: Two times a day (BID) | ORAL | 0 refills | Status: DC

## 2017-03-01 MED ORDER — FLUCONAZOLE 150 MG PO TABS
150.0000 mg | ORAL_TABLET | Freq: Once | ORAL | 1 refills | Status: AC
Start: 2017-03-01 — End: 2017-03-01

## 2017-03-01 NOTE — Progress Notes (Signed)
37 year old MWF G0 presents with complaint of vaginal discharge causing irritation, itching and odor for the past week.  Denies urinary symptoms, abdominal pain or fever. History of BV in the past. Mirena IUD placed 02/2013 light monthly cycles. Desires no children. Hyperthyroid endocrinologist manages. Reports annual exams at primary care, does have scheduled annual in March here. Last annual here 2015. Reports normal Pap history.  Exam: Appears well. External genitalia mild erythema, speculum exam moderate amount of a white adherent discharge with odor noted, wet prep positive for clues, TNTC bacteria. Bimanual no CMT or adnexal tenderness. IUD strings visible.  Bacteria vaginosis  Plan: Flagyl 500 twice daily for 7 days #14 prescription, proper use given and reviewed alcohol precautions. Diflucan 150 by mouth 1 dose for vaginal itching, instructed to call if no relief of symptoms.

## 2017-03-01 NOTE — Patient Instructions (Signed)
Bacterial Vaginosis Bacterial vaginosis is a vaginal infection that occurs when the normal balance of bacteria in the vagina is disrupted. It results from an overgrowth of certain bacteria. This is the most common vaginal infection among women ages 15-44. Because bacterial vaginosis increases your risk for STIs (sexually transmitted infections), getting treated can help reduce your risk for chlamydia, gonorrhea, herpes, and HIV (human immunodeficiency virus). Treatment is also important for preventing complications in pregnant women, because this condition can cause an early (premature) delivery. What are the causes? This condition is caused by an increase in harmful bacteria that are normally present in small amounts in the vagina. However, the reason that the condition develops is not fully understood. What increases the risk? The following factors may make you more likely to develop this condition:  Having a new sexual partner or multiple sexual partners.  Having unprotected sex.  Douching.  Having an intrauterine device (IUD).  Smoking.  Drug and alcohol abuse.  Taking certain antibiotic medicines.  Being pregnant.  You cannot get bacterial vaginosis from toilet seats, bedding, swimming pools, or contact with objects around you. What are the signs or symptoms? Symptoms of this condition include:  Grey or white vaginal discharge. The discharge can also be watery or foamy.  A fish-like odor with discharge, especially after sexual intercourse or during menstruation.  Itching in and around the vagina.  Burning or pain with urination.  Some women with bacterial vaginosis have no signs or symptoms. How is this diagnosed? This condition is diagnosed based on:  Your medical history.  A physical exam of the vagina.  Testing a sample of vaginal fluid under a microscope to look for a large amount of bad bacteria or abnormal cells. Your health care provider may use a cotton swab  or a small wooden spatula to collect the sample.  How is this treated? This condition is treated with antibiotics. These may be given as a pill, a vaginal cream, or a medicine that is put into the vagina (suppository). If the condition comes back after treatment, a second round of antibiotics may be needed. Follow these instructions at home: Medicines  Take over-the-counter and prescription medicines only as told by your health care provider.  Take or use your antibiotic as told by your health care provider. Do not stop taking or using the antibiotic even if you start to feel better. General instructions  If you have a female sexual partner, tell her that you have a vaginal infection. She should see her health care provider and be treated if she has symptoms. If you have a female sexual partner, he does not need treatment.  During treatment: ? Avoid sexual activity until you finish treatment. ? Do not douche. ? Avoid alcohol as directed by your health care provider. ? Avoid breastfeeding as directed by your health care provider.  Drink enough water and fluids to keep your urine clear or pale yellow.  Keep the area around your vagina and rectum clean. ? Wash the area daily with warm water. ? Wipe yourself from front to back after using the toilet.  Keep all follow-up visits as told by your health care provider. This is important. How is this prevented?  Do not douche.  Wash the outside of your vagina with warm water only.  Use protection when having sex. This includes latex condoms and dental dams.  Limit how many sexual partners you have. To help prevent bacterial vaginosis, it is best to have sex with just   one partner (monogamous).  Make sure you and your sexual partner are tested for STIs.  Wear cotton or cotton-lined underwear.  Avoid wearing tight pants and pantyhose, especially during summer.  Limit the amount of alcohol that you drink.  Do not use any products that  contain nicotine or tobacco, such as cigarettes and e-cigarettes. If you need help quitting, ask your health care provider.  Do not use illegal drugs. Where to find more information:  Centers for Disease Control and Prevention: www.cdc.gov/std  American Sexual Health Association (ASHA): www.ashastd.org  [place] Department of Health and Human Services, Office on Women's Health: www.womenshealth.gov/ or https://www.womenshealth.gov/a-z-topics/bacterial-vaginosis Contact a health care provider if:  Your symptoms do not improve, even after treatment.  You have more discharge or pain when urinating.  You have a fever.  You have pain in your abdomen.  You have pain during sex.  You have vaginal bleeding between periods. Summary  Bacterial vaginosis is a vaginal infection that occurs when the normal balance of bacteria in the vagina is disrupted.  Because bacterial vaginosis increases your risk for STIs (sexually transmitted infections), getting treated can help reduce your risk for chlamydia, gonorrhea, herpes, and HIV (human immunodeficiency virus). Treatment is also important for preventing complications in pregnant women, because the condition can cause an early (premature) delivery.  This condition is treated with antibiotic medicines. These may be given as a pill, a vaginal cream, or a medicine that is put into the vagina (suppository). This information is not intended to replace advice given to you by your health care provider. Make sure you discuss any questions you have with your health care provider. Document Released: 01/24/2005 Document Revised: 05/30/2016 Document Reviewed: 10/10/2015 Elsevier Interactive Patient Education  2018 Elsevier Inc.  

## 2017-04-11 ENCOUNTER — Encounter: Payer: 59 | Admitting: Women's Health

## 2017-04-11 DIAGNOSIS — Z0289 Encounter for other administrative examinations: Secondary | ICD-10-CM

## 2018-09-27 ENCOUNTER — Other Ambulatory Visit: Payer: Self-pay

## 2018-10-01 ENCOUNTER — Other Ambulatory Visit: Payer: Self-pay

## 2018-10-01 ENCOUNTER — Encounter: Payer: Self-pay | Admitting: Women's Health

## 2018-10-01 ENCOUNTER — Ambulatory Visit (INDEPENDENT_AMBULATORY_CARE_PROVIDER_SITE_OTHER): Payer: 59 | Admitting: Women's Health

## 2018-10-01 VITALS — BP 118/80 | Ht 67.0 in | Wt 202.0 lb

## 2018-10-01 DIAGNOSIS — Z13220 Encounter for screening for lipoid disorders: Secondary | ICD-10-CM

## 2018-10-01 DIAGNOSIS — N912 Amenorrhea, unspecified: Secondary | ICD-10-CM

## 2018-10-01 DIAGNOSIS — E039 Hypothyroidism, unspecified: Secondary | ICD-10-CM | POA: Insufficient documentation

## 2018-10-01 DIAGNOSIS — F17200 Nicotine dependence, unspecified, uncomplicated: Secondary | ICD-10-CM

## 2018-10-01 DIAGNOSIS — Z01419 Encounter for gynecological examination (general) (routine) without abnormal findings: Secondary | ICD-10-CM

## 2018-10-01 DIAGNOSIS — Z716 Tobacco abuse counseling: Secondary | ICD-10-CM

## 2018-10-01 HISTORY — DX: Nicotine dependence, unspecified, uncomplicated: F17.200

## 2018-10-01 MED ORDER — VARENICLINE TARTRATE 1 MG PO TABS: 1.0000 mg | ORAL_TABLET | Freq: Two times a day (BID) | ORAL | 3 refills | Status: DC

## 2018-10-01 MED ORDER — VARENICLINE TARTRATE 0.5 MG PO TABS: 0.5000 mg | ORAL_TABLET | Freq: Two times a day (BID) | ORAL | 0 refills | Status: DC

## 2018-10-01 NOTE — Progress Notes (Signed)
NEIKA FRIEDRICHS [birth year] [id number]    History:    Presents for annual exam.  02/2013 Mirena IUD, rare cycles but has been cycling monthly for the last few months.  Increased dysmenorrhea in the past few months as well.  Would like to have the IUD removed and replaced.  Desires no children.  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  Last here 2015.  Primary care manages hypothyroidism.  Smoker desires to stop, has used Chantix in the past with good results would like to try again.  Hypothyroid Dr. Forde Dandy manages.  Past medical history, past surgical history, family history and social history were all reviewed and documented in the EPIC chart.  Desk job.  Numerous family issues with drug addiction/mental health problems with brother-in-law who has been in rehab.  Reports has stopped drinking totally had been drinking approximately 1 bottle of wine daily and is now ready to stop smoking.  Numerous family members have heart disease.  ROS:  A ROS was performed and pertinent positives and negatives are included.  Exam:  Vitals:   10/01/18 0903  BP: 118/80  Weight: 202 lb (91.6 kg)  Height: 5\' 7"  (1.702 m)   Body mass index is 31.64 kg/m.   General appearance:  Normal Thyroid:  Symmetrical, normal in size, without palpable masses or nodularity. Respiratory  Auscultation:  Clear without wheezing or rhonchi Cardiovascular  Auscultation:  Regular rate, without rubs, murmurs or gallops  Edema/varicosities:  Not grossly evident Abdominal  Soft,nontender, without masses, guarding or rebound.  Liver/spleen:  No organomegaly noted  Hernia:  None appreciated  Skin  Inspection:  Grossly normal   Breasts: Examined lying and sitting.     Right: Without masses, retractions, discharge or axillary adenopathy.     Left: Without masses, retractions, discharge or axillary adenopathy. Gentitourinary   Inguinal/mons:  Normal without inguinal adenopathy  External genitalia:  Normal  BUS/Urethra/Skene's glands:  Normal  Vagina:   Normal  Cervix:  Normal IUD strings visible, scant menses  Uterus:  normal in size, shape and contour.  Midline and mobile  Adnexa/parametria:     Rt: Without masses or tenderness.   Lt: Without masses or tenderness.  Anus and perineum: Normal  Digital rectal exam: Normal sphincter tone without palpated masses or tenderness  Assessment/Plan:  38 y.o. MWF G0 for annual exam.  02/2013 Mirena IUD rare cycles Smoker Hypothyroidism -Dr. Forde Dandy manages Obesity 2007 CIN-1  Plan: Schedule appointment for Dr. Phineas Real to remove and replace IUD, abstain until, qualitative hCG pending.  SBEs, annual screening mammogram at 40, calcium rich foods, MVI daily encouraged.  Aware of importance of decreasing calories and increasing exercise.  Smoking cessation discussed, Chantix reviewed side effect of strange dreams, pick stopping smoking date, prescription given for first month with 0.5 mg and second month with 1 mg.  Aware of tips for quitting, has used Chantix in the past.  CBC, CMP, lipid panel, qualitative hCG, Pap with high risk HPV, new screening guidelines reviewed.    [employer], 9:11 AM 10/01/2018

## 2018-10-01 NOTE — Addendum Note (Signed)
Addended by: Janalyn Harder A on: 10/01/2018 10:00 AM   Modules accepted: Orders

## 2018-10-01 NOTE — Patient Instructions (Signed)
Health Maintenance, Female Adopting a healthy lifestyle and getting preventive care are important in promoting health and wellness. Ask your health care provider about:  The right schedule for you to have regular tests and exams.  Things you can do on your own to prevent diseases and keep yourself healthy. What should I know about diet, weight, and exercise? Eat a healthy diet   Eat a diet that includes plenty of vegetables, fruits, low-fat dairy products, and lean protein.  Do not eat a lot of foods that are high in solid fats, added sugars, or sodium. Maintain a healthy weight Body mass index (BMI) is used to identify weight problems. It estimates body fat based on height and weight. Your health care provider can help determine your BMI and help you achieve or maintain a healthy weight. Get regular exercise Get regular exercise. This is one of the most important things you can do for your health. Most adults should:  Exercise for at least 150 minutes each week. The exercise should increase your heart rate and make you sweat (moderate-intensity exercise).  Do strengthening exercises at least twice a week. This is in addition to the moderate-intensity exercise.  Spend less time sitting. Even light physical activity can be beneficial. Watch cholesterol and blood lipids Have your blood tested for lipids and cholesterol at 36 years of age, then have this test every 5 years. Have your cholesterol levels checked more often if:  Your lipid or cholesterol levels are high.  You are older than 36 years of age.  You are at high risk for heart disease. What should I know about cancer screening? Depending on your health history and family history, you may need to have cancer screening at various ages. This may include screening for:  Breast cancer.  Cervical cancer.  Colorectal cancer.  Skin cancer.  Lung cancer. What should I know about heart disease, diabetes, and high blood  pressure? Blood pressure and heart disease  High blood pressure causes heart disease and increases the risk of stroke. This is more likely to develop in people who have high blood pressure readings, are of African descent, or are overweight.  Have your blood pressure checked: ? Every 3-5 years if you are 18-39 years of age. ? Every year if you are 40 years old or older. Diabetes Have regular diabetes screenings. This checks your fasting blood sugar level. Have the screening done:  Once every three years after age 40 if you are at a normal weight and have a low risk for diabetes.  More often and at a younger age if you are overweight or have a high risk for diabetes. What should I know about preventing infection? Hepatitis B If you have a higher risk for hepatitis B, you should be screened for this virus. Talk with your health care provider to find out if you are at risk for hepatitis B infection. Hepatitis C Testing is recommended for:  Everyone born from 1945 through 1965.  Anyone with known risk factors for hepatitis C. Sexually transmitted infections (STIs)  Get screened for STIs, including gonorrhea and chlamydia, if: ? You are sexually active and are younger than 36 years of age. ? You are older than 36 years of age and your health care provider tells you that you are at risk for this type of infection. ? Your sexual activity has changed since you were last screened, and you are at increased risk for chlamydia or gonorrhea. Ask your health care provider if   you are at risk.  Ask your health care provider about whether you are at high risk for HIV. Your health care provider may recommend a prescription medicine to help prevent HIV infection. If you choose to take medicine to prevent HIV, you should first get tested for HIV. You should then be tested every 3 months for as long as you are taking the medicine. Pregnancy  If you are about to stop having your period (premenopausal) and  you may become pregnant, seek counseling before you get pregnant.  Take 400 to 800 micrograms (mcg) of folic acid every day if you become pregnant.  Ask for birth control (contraception) if you want to prevent pregnancy. Osteoporosis and menopause Osteoporosis is a disease in which the bones lose minerals and strength with aging. This can result in bone fractures. If you are 65 years old or older, or if you are at risk for osteoporosis and fractures, ask your health care provider if you should:  Be screened for bone loss.  Take a calcium or vitamin D supplement to lower your risk of fractures.  Be given hormone replacement therapy (HRT) to treat symptoms of menopause. Follow these instructions at home: Lifestyle  Do not use any products that contain nicotine or tobacco, such as cigarettes, e-cigarettes, and chewing tobacco. If you need help quitting, ask your health care provider.  Do not use street drugs.  Do not share needles.  Ask your health care provider for help if you need support or information about quitting drugs. Alcohol use  Do not drink alcohol if: ? Your health care provider tells you not to drink. ? You are pregnant, may be pregnant, or are planning to become pregnant.  If you drink alcohol: ? Limit how much you use to 0-1 drink a day. ? Limit intake if you are breastfeeding.  Be aware of how much alcohol is in your drink. In the [place] one drink equals one 12 oz bottle of beer (355 mL), one 5 oz glass of wine (148 mL), or one 1 oz glass of hard liquor (44 mL). General instructions  Schedule regular health, dental, and eye exams.  Stay current with your vaccines.  Tell your health care provider if: ? You often feel depressed. ? You have ever been abused or do not feel safe at home. Summary  Adopting a healthy lifestyle and getting preventive care are important in promoting health and wellness.  Follow your health care provider's instructions about healthy  diet, exercising, and getting tested or screened for diseases.  Follow your health care provider's instructions on monitoring your cholesterol and blood pressure. This information is not intended to replace advice given to you by your health care provider. Make sure you discuss any questions you have with your health care provider. Document Released: 08/09/2010 Document Revised: 01/17/2018 Document Reviewed: 01/17/2018 Elsevier Patient Education  2020 Elsevier Inc.  

## 2018-10-02 LAB — CBC WITH DIFFERENTIAL/PLATELET
Absolute Monocytes: 378 cells/uL (ref 200–950)
Basophils Absolute: 60 cells/uL (ref 0–200)
Basophils Relative: 1 %
Eosinophils Absolute: 156 cells/uL (ref 15–500)
Eosinophils Relative: 2.6 %
HCT: 44.4 % (ref 35.0–45.0)
Hemoglobin: 14.9 g/dL (ref 11.7–15.5)
Lymphs Abs: 2394 cells/uL (ref 850–3900)
MCH: 28.8 pg (ref 27.0–33.0)
MCHC: 33.6 g/dL (ref 32.0–36.0)
MCV: 85.9 fL (ref 80.0–100.0)
MPV: 9.4 fL (ref 7.5–12.5)
Monocytes Relative: 6.3 %
Neutro Abs: 3012 cells/uL (ref 1500–7800)
Neutrophils Relative %: 50.2 %
Platelets: 342 10*3/uL (ref 140–400)
RBC: 5.17 10*6/uL — ABNORMAL HIGH (ref 3.80–5.10)
RDW: 11.8 % (ref 11.0–15.0)
Total Lymphocyte: 39.9 %
WBC: 6 10*3/uL (ref 3.8–10.8)

## 2018-10-02 LAB — COMPREHENSIVE METABOLIC PANEL
AG Ratio: 1.7 (calc) (ref 1.0–2.5)
ALT: 42 U/L — ABNORMAL HIGH (ref 6–29)
AST: 26 U/L (ref 10–30)
Albumin: 4.4 g/dL (ref 3.6–5.1)
Alkaline phosphatase (APISO): 57 U/L (ref 31–125)
BUN: 12 mg/dL (ref 7–25)
CO2: 28 mmol/L (ref 20–32)
Calcium: 9.7 mg/dL (ref 8.6–10.2)
Chloride: 104 mmol/L (ref 98–110)
Creat: 0.96 mg/dL (ref 0.50–1.10)
Globulin: 2.6 g/dL (calc) (ref 1.9–3.7)
Glucose, Bld: 103 mg/dL — ABNORMAL HIGH (ref 65–99)
Potassium: 4.3 mmol/L (ref 3.5–5.3)
Sodium: 139 mmol/L (ref 135–146)
Total Bilirubin: 0.6 mg/dL (ref 0.2–1.2)
Total Protein: 7 g/dL (ref 6.1–8.1)

## 2018-10-02 LAB — LIPID PANEL
Cholesterol: 143 mg/dL (ref <200)
HDL: 51 mg/dL (ref >=50)
LDL Cholesterol (Calc): 80 mg/dL (calc)
Non-HDL Cholesterol (Calc): 92 mg/dL (calc) (ref <130)
Total CHOL/HDL Ratio: 2.8 (calc) (ref <5.0)
Triglycerides: 50 mg/dL (ref <150)

## 2018-10-02 LAB — PAP, TP IMAGING W/ HPV RNA, RFLX HPV TYPE 16,18/45: HPV DNA High Risk: NOT DETECTED

## 2018-10-02 LAB — HCG, SERUM, QUALITATIVE: Preg, Serum: NEGATIVE

## 2018-10-03 ENCOUNTER — Other Ambulatory Visit: Payer: Self-pay

## 2018-10-03 DIAGNOSIS — R7989 Other specified abnormal findings of blood chemistry: Secondary | ICD-10-CM

## 2018-10-08 ENCOUNTER — Ambulatory Visit (INDEPENDENT_AMBULATORY_CARE_PROVIDER_SITE_OTHER): Payer: 59 | Admitting: Gynecology

## 2018-10-08 ENCOUNTER — Encounter: Payer: Self-pay | Admitting: Gynecology

## 2018-10-08 ENCOUNTER — Other Ambulatory Visit: Payer: Self-pay

## 2018-10-08 VITALS — BP 118/76

## 2018-10-08 DIAGNOSIS — Z30433 Encounter for removal and reinsertion of intrauterine contraceptive device: Secondary | ICD-10-CM | POA: Diagnosis not present

## 2018-10-08 HISTORY — PX: OTHER SURGICAL HISTORY: SHX169

## 2018-10-08 NOTE — Progress Notes (Signed)
    Melissa Whitney December 04, 1980 [id number]        38 y.o.  G0P0000  presents for Mirena IUD replacement. She has read through the booklet, has no contraindications and signed the consent form.  I reviewed the removal and insertional process with her as well as the risks to include infection, either immediate or long-term, uterine perforation or migration requiring surgery to remove, other complications such as pain, hormonal side effects and possibility of failure with subsequent pregnancy.   Exam with Caryn Bee assistant Vitals:   10/08/18 1404  BP: 118/76    Pelvic: External BUS vagina normal. Cervix normal with IUD string visualized. Uterus axial normal size shape contour midline mobile nontender. Adnexa without masses or tenderness.  Procedure: The cervix was visualized with a speculum and the old Mirena IUD string was grasped with a [place] forcep, the IUD removed, shown to the patient and discarded.  The cervix was then cleansed with Betadine, anterior lip grasped with a single-tooth tenaculum, the cervix was gently dilated with a disposable dilator, the uterus was sounded and a Mirena IUD was placed according to manufacturer's recommendations without difficulty. The strings were trimmed. The patient tolerated well and will follow up in one month for a postinsertional check.  Lot number:  [id number]    Anastasio Auerbach MD, 2:24 PM 10/08/2018

## 2018-10-08 NOTE — Patient Instructions (Signed)

## 2018-10-09 ENCOUNTER — Encounter: Payer: Self-pay | Admitting: Gynecology

## 2018-10-29 ENCOUNTER — Encounter: Payer: Self-pay | Admitting: Gynecology

## 2018-11-05 ENCOUNTER — Ambulatory Visit: Payer: 59 | Admitting: Gynecology

## 2018-11-05 ENCOUNTER — Other Ambulatory Visit: Payer: Self-pay

## 2018-11-05 ENCOUNTER — Encounter: Payer: Self-pay | Admitting: Gynecology

## 2018-11-05 VITALS — BP 118/76

## 2018-11-05 DIAGNOSIS — Z30431 Encounter for routine checking of intrauterine contraceptive device: Secondary | ICD-10-CM | POA: Diagnosis not present

## 2018-11-05 NOTE — Progress Notes (Signed)
    Melissa Whitney 03/29/1980 [id number]        38 y.o.  G0P0000 presents for IUD check.  Does note that husband and irritation on his penis after intercourse was wondering whether it was due to the string.  Occurred once.  Otherwise doing well with the IUD.  Past medical history,surgical history, problem list, medications, allergies, family history and social history were all reviewed and documented in the EPIC chart.  Directed ROS with pertinent positives and negatives documented in the history of present illness/assessment and plan.  Exam: Caryn Bee assistant Vitals:   11/05/18 1435  BP: 118/76   General appearance:  Normal Abdomen soft nontender without mass guarding rebound Pelvic external BUS vagina normal.  Cervix normal.  IUD string visualized and appropriate length.  Uterus grossly normal midline mobile nontender.  Adnexa without masses or tenderness.  Assessment/Plan:  38 y.o. G0P0000 with normal follow-up IUD check.  Unclear whether IUD string irritated her husband or it was unrelated.  At this point the patient prefers not to have the string trimmed but just to wait and see if it becomes a recurrent issue.  Otherwise she will follow-up when due for her annual exam.    Anastasio Auerbach MD, 2:42 PM 11/05/2018

## 2018-11-05 NOTE — Patient Instructions (Signed)
Follow-up when due for annual exam.

## 2019-01-17 ENCOUNTER — Other Ambulatory Visit: Payer: Self-pay

## 2019-01-17 DIAGNOSIS — Z20822 Contact with and (suspected) exposure to covid-19: Secondary | ICD-10-CM

## 2019-01-20 LAB — NOVEL CORONAVIRUS, NAA: SARS-CoV-2, NAA: NOT DETECTED

## 2019-02-08 DIAGNOSIS — Z8669 Personal history of other diseases of the nervous system and sense organs: Secondary | ICD-10-CM

## 2019-02-08 HISTORY — DX: Personal history of other diseases of the nervous system and sense organs: Z86.69

## 2019-02-15 ENCOUNTER — Other Ambulatory Visit: Payer: 59

## 2019-03-05 ENCOUNTER — Other Ambulatory Visit: Payer: 59

## 2019-03-06 ENCOUNTER — Ambulatory Visit: Payer: 59 | Attending: Internal Medicine

## 2019-03-06 DIAGNOSIS — Z20822 Contact with and (suspected) exposure to covid-19: Secondary | ICD-10-CM

## 2019-03-07 LAB — NOVEL CORONAVIRUS, NAA: SARS-CoV-2, NAA: NOT DETECTED

## 2020-01-16 ENCOUNTER — Other Ambulatory Visit: Payer: Self-pay

## 2020-01-16 ENCOUNTER — Ambulatory Visit (INDEPENDENT_AMBULATORY_CARE_PROVIDER_SITE_OTHER): Payer: 59 | Admitting: Nurse Practitioner

## 2020-01-16 ENCOUNTER — Encounter: Payer: Self-pay | Admitting: Nurse Practitioner

## 2020-01-16 VITALS — BP 118/74 | Ht 67.0 in | Wt 227.0 lb

## 2020-01-16 DIAGNOSIS — Z789 Other specified health status: Secondary | ICD-10-CM

## 2020-01-16 DIAGNOSIS — Z01419 Encounter for gynecological examination (general) (routine) without abnormal findings: Secondary | ICD-10-CM | POA: Diagnosis not present

## 2020-01-16 DIAGNOSIS — Z30432 Encounter for removal of intrauterine contraceptive device: Secondary | ICD-10-CM | POA: Diagnosis not present

## 2020-01-16 NOTE — Patient Instructions (Signed)
Health Maintenance, Female Adopting a healthy lifestyle and getting preventive care are important in promoting health and wellness. Ask your health care provider about:  The right schedule for you to have regular tests and exams.  Things you can do on your own to prevent diseases and keep yourself healthy. What should I know about diet, weight, and exercise? Eat a healthy diet   Eat a diet that includes plenty of vegetables, fruits, low-fat dairy products, and lean protein.  Do not eat a lot of foods that are high in solid fats, added sugars, or sodium. Maintain a healthy weight Body mass index (BMI) is used to identify weight problems. It estimates body fat based on height and weight. Your health care provider can help determine your BMI and help you achieve or maintain a healthy weight. Get regular exercise Get regular exercise. This is one of the most important things you can do for your health. Most adults should:  Exercise for at least 150 minutes each week. The exercise should increase your heart rate and make you sweat (moderate-intensity exercise).  Do strengthening exercises at least twice a week. This is in addition to the moderate-intensity exercise.  Spend less time sitting. Even light physical activity can be beneficial. Watch cholesterol and blood lipids Have your blood tested for lipids and cholesterol at 37 years of age, then have this test every 5 years. Have your cholesterol levels checked more often if:  Your lipid or cholesterol levels are high.  You are older than 37 years of age.  You are at high risk for heart disease. What should I know about cancer screening? Depending on your health history and family history, you may need to have cancer screening at various ages. This may include screening for:  Breast cancer.  Cervical cancer.  Colorectal cancer.  Skin cancer.  Lung cancer. What should I know about heart disease, diabetes, and high blood  pressure? Blood pressure and heart disease  High blood pressure causes heart disease and increases the risk of stroke. This is more likely to develop in people who have high blood pressure readings, are of African descent, or are overweight.  Have your blood pressure checked: ? Every 3-5 years if you are 18-39 years of age. ? Every year if you are 40 years old or older. Diabetes Have regular diabetes screenings. This checks your fasting blood sugar level. Have the screening done:  Once every three years after age 40 if you are at a normal weight and have a low risk for diabetes.  More often and at a younger age if you are overweight or have a high risk for diabetes. What should I know about preventing infection? Hepatitis B If you have a higher risk for hepatitis B, you should be screened for this virus. Talk with your health care provider to find out if you are at risk for hepatitis B infection. Hepatitis C Testing is recommended for:  Everyone born from 1945 through 1965.  Anyone with known risk factors for hepatitis C. Sexually transmitted infections (STIs)  Get screened for STIs, including gonorrhea and chlamydia, if: ? You are sexually active and are younger than 37 years of age. ? You are older than 37 years of age and your health care provider tells you that you are at risk for this type of infection. ? Your sexual activity has changed since you were last screened, and you are at increased risk for chlamydia or gonorrhea. Ask your health care provider if   you are at risk.  Ask your health care provider about whether you are at high risk for HIV. Your health care provider may recommend a prescription medicine to help prevent HIV infection. If you choose to take medicine to prevent HIV, you should first get tested for HIV. You should then be tested every 3 months for as long as you are taking the medicine. Pregnancy  If you are about to stop having your period (premenopausal) and  you may become pregnant, seek counseling before you get pregnant.  Take 400 to 800 micrograms (mcg) of folic acid every day if you become pregnant.  Ask for birth control (contraception) if you want to prevent pregnancy. Osteoporosis and menopause Osteoporosis is a disease in which the bones lose minerals and strength with aging. This can result in bone fractures. If you are 65 years old or older, or if you are at risk for osteoporosis and fractures, ask your health care provider if you should:  Be screened for bone loss.  Take a calcium or vitamin D supplement to lower your risk of fractures.  Be given hormone replacement therapy (HRT) to treat symptoms of menopause. Follow these instructions at home: Lifestyle  Do not use any products that contain nicotine or tobacco, such as cigarettes, e-cigarettes, and chewing tobacco. If you need help quitting, ask your health care provider.  Do not use street drugs.  Do not share needles.  Ask your health care provider for help if you need support or information about quitting drugs. Alcohol use  Do not drink alcohol if: ? Your health care provider tells you not to drink. ? You are pregnant, may be pregnant, or are planning to become pregnant.  If you drink alcohol: ? Limit how much you use to 0-1 drink a day. ? Limit intake if you are breastfeeding.  Be aware of how much alcohol is in your drink. In the [place] one drink equals one 12 oz bottle of beer (355 mL), one 5 oz glass of wine (148 mL), or one 1 oz glass of hard liquor (44 mL). General instructions  Schedule regular health, dental, and eye exams.  Stay current with your vaccines.  Tell your health care provider if: ? You often feel depressed. ? You have ever been abused or do not feel safe at home. Summary  Adopting a healthy lifestyle and getting preventive care are important in promoting health and wellness.  Follow your health care provider's instructions about healthy  diet, exercising, and getting tested or screened for diseases.  Follow your health care provider's instructions on monitoring your cholesterol and blood pressure. This information is not intended to replace advice given to you by your health care provider. Make sure you discuss any questions you have with your health care provider. Document Revised: 01/17/2018 Document Reviewed: 01/17/2018 Elsevier Patient Education  2020 Elsevier Inc.  

## 2020-01-16 NOTE — Progress Notes (Signed)
   Melissa Whitney 1980/09/06 [id number]   History:  39 y.o. G0 presents for annual exam. Mirena IUD inserted 09/2018. She would like this removed today as she wants to try to conceived. She has started a prenatal vitamin.  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 Hypothyroidism managed by PCP.   Gynecologic History No LMP recorded. (Menstrual status: IUD).   Contraception: IUD Last Pap: 10/01/2018. Results were: normal  Past medical history, past surgical history, family history and social history were all reviewed and documented in the EPIC chart.  ROS:  A ROS was performed and pertinent positives and negatives are included.  Exam:  Vitals:   01/16/20 0808  BP: 118/74  Weight: 227 lb (103 kg)  Height: 5\' 7"  (1.702 m)   Body mass index is 35.55 kg/m.  General appearance:  Normal Thyroid:  Symmetrical, normal in size, without palpable masses or nodularity. Respiratory  Auscultation:  Clear without wheezing or rhonchi Cardiovascular  Auscultation:  Regular rate, without rubs, murmurs or gallops  Edema/varicosities:  Not grossly evident Abdominal  Soft,nontender, without masses, guarding or rebound.  Liver/spleen:  No organomegaly noted  Hernia:  None appreciated  Skin  Inspection:  Grossly normal   Breasts: Examined lying and sitting.   Right: Without masses, retractions, discharge or axillary adenopathy.   Left: Without masses, retractions, discharge or axillary adenopathy. Gentitourinary   Inguinal/mons:  Normal without inguinal adenopathy  External genitalia:  Normal  BUS/Urethra/Skene's glands:  Normal  Vagina:  Normal  Cervix:  Normal, IUD string visualize and removed  Uterus:  Normal in size, shape and contour.  Midline and mobile  Adnexa/parametria:     Rt: Without masses or tenderness.   Lt: Without masses or tenderness.  Anus and perineum: Normal  Assessment/Plan:  39 y.o. G0 for annual exam.   Well female exam with routine gynecological exam - Education  provided on SBEs, importance of preventative screenings, current guidelines, high calcium diet, regular exercise, and multivitamin daily.   Encounter for IUD removal - IUD removed, shown to patient, and discarded. Ibuprofen as needed for cramping today.   Attempting to conceive - Discussed pregnancy-safe behaviors, ovulation signs, increasing intercourse during ovulation period, and continuing prenatal vitamin. She is aware we do not deliver babies and will establish with OB when appropriate. Due to age it is recommended that if unsuccessful after 6 months she see a fertility specialist. She is agreeable.   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 Will repeat at 5-year interval per guidelines.   Follow up in 1 year for annual   [place], 8:28 AM 01/16/2020

## 2020-02-03 ENCOUNTER — Telehealth: Payer: Self-pay | Admitting: *Deleted

## 2020-02-03 ENCOUNTER — Other Ambulatory Visit: Payer: Self-pay | Admitting: Nurse Practitioner

## 2020-02-03 DIAGNOSIS — N912 Amenorrhea, unspecified: Secondary | ICD-10-CM

## 2020-02-03 MED ORDER — MEDROXYPROGESTERONE ACETATE 5 MG PO TABS: 5.0000 mg | ORAL_TABLET | Freq: Every day | ORAL | 0 refills | Status: DC

## 2020-02-03 NOTE — Telephone Encounter (Signed)
Patient was seen on 01/16/20 and had IUD removed she has yet to receive her cycle, she called today asking if clomid can be prescribed to "jump start" ovulation, reports no ovulation this month. Patient it worried she will have a hard time trying to conceive at her age. Please advise

## 2020-02-03 NOTE — Telephone Encounter (Signed)
Patient informed with below note. 

## 2020-02-03 NOTE — Telephone Encounter (Signed)
Let her know it can be normal to take time to start cycles back. We can do Provera for 10 days to bring on withdrawal bleeding to help start this back. If she does not have bleeding 10-14 after finishing the Provera I want her to let me know. With her age it is recommend she see a fertility specialist after 6 months of trying to conceive.

## 2020-02-09 ENCOUNTER — Other Ambulatory Visit: Payer: Self-pay | Admitting: Nurse Practitioner

## 2020-02-09 DIAGNOSIS — N912 Amenorrhea, unspecified: Secondary | ICD-10-CM

## 2020-03-12 ENCOUNTER — Telehealth: Payer: Self-pay | Admitting: *Deleted

## 2020-03-12 NOTE — Telephone Encounter (Signed)
Because of her age and irregular cycles a referral to a fertility specialist is appropriate. We could do some initial lab work here if she prefers that will check hormone levels and ovarian reserve/egg count. If she would like to have them done here I will get those ordered and let her know the exact plan for those, otherwise we can go ahead and send the referral and she can have testing done there. It is up to her and can be done either way.

## 2020-03-12 NOTE — Telephone Encounter (Signed)
Patient had IUD removed in [place] is trying to conceive, she had to take provera 5 mg tablet x 10 days to help start cycle last month. LMP:02/09/20 6 day cycle,tracking ovulation on app. Reports her cycle was due to start on Monday and no cycle yet, no cramping no symptoms at all. Patient wanted to know your thoughts,she asked about referral to fertility. I did see you mentioned due to her age after 6 months of trying to conceive and unsuccessful, referral to fertility is recommended. However she is concerned about not yet having monthly cycles and what to do. Please advise

## 2020-03-13 NOTE — Telephone Encounter (Signed)
Patient said she would prefer to have referral to [facility]. Office notes faxed, I told patient they will call her to schedule if she does not hear from them in about 1-1/2 week to call them.

## 2020-09-21 LAB — OB RESULTS CONSOLE ABO/RH: RH Type: POSITIVE

## 2020-10-06 LAB — OB RESULTS CONSOLE HIV ANTIBODY (ROUTINE TESTING): HIV: NONREACTIVE

## 2020-10-06 LAB — OB RESULTS CONSOLE VARICELLA ZOSTER ANTIBODY, IGG
Varicella: IMMUNE
Varicella: IMMUNE

## 2020-10-06 LAB — OB RESULTS CONSOLE HGB/HCT, BLOOD
HCT: 41 (ref 29–41)
Hemoglobin: 14.1

## 2020-10-06 LAB — OB RESULTS CONSOLE RPR: RPR: NONREACTIVE

## 2020-10-06 LAB — OB RESULTS CONSOLE PLATELET COUNT: Platelets: 335

## 2020-10-06 LAB — OB RESULTS CONSOLE RUBELLA ANTIBODY, IGM
Rubella: IMMUNE
Rubella: IMMUNE

## 2020-10-06 LAB — OB RESULTS CONSOLE HEPATITIS B SURFACE ANTIGEN: Hepatitis B Surface Ag: NEGATIVE

## 2020-11-03 LAB — OB RESULTS CONSOLE GC/CHLAMYDIA
Chlamydia: NEGATIVE
Gonorrhea: NEGATIVE

## 2020-11-23 LAB — TSH: TSH: 1.76

## 2020-11-23 LAB — AFP, SERUM, OPEN SPINA BIFIDA: AFP: NEGATIVE

## 2021-01-25 LAB — GLUCOSE TOLERANCE, 1 HOUR: Glucose, 1 hour: 104

## 2021-01-25 LAB — OB RESULTS CONSOLE HGB/HCT, BLOOD
HCT: 36 (ref 29–41)
Hemoglobin: 12.3

## 2021-01-25 LAB — OB RESULTS CONSOLE PLATELET COUNT: Platelets: 323

## 2021-02-07 NOTE — L&D Delivery Note (Addendum)
[facility] Delivery Note ? ?[name] is a 41 y.o. G1P0000 s/p SVD at [redacted]w[redacted]d She was admitted for IOL due to gHTN.  ? ?ROM: 3h 477mith clear fluid ?GBS Status: negative ? ?Delivery Date/Time: 04/16/21, 022023 ?Delivery: Pt progressed nicely after AROM to C/C/+1. Labored down for about an hour, pushed for about 20 minutes. Called to room and patient was complete and pushing. Head delivered ROA. A single loose nuchal cord was present and and easily reduced. Shoulder and body delivered in usual fashion. Infant with spontaneous cry, placed on mother's abdomen, dried and stimulated. Cord clamped x 2 after 1-minute delay, and cut by FOB under my direct supervision. Cord blood drawn. Placenta delivered spontaneously with gentle cord traction. Fundus firm with massage and Pitocin. Labia, perineum, vagina, and cervix were inspected, found to have a 2nd degree perineal laceration and left periurethral tear. The 2nd degree perineal laceration was repaired with a 2-0 Vicryl in usual running fashion. The left periurethral tear was repaired with single interrupted suture using 4-0 Monocryl.  ? ?Placenta: Intact, 3VC- given to family in cooler as requested.  ?Complications: None ?Lacerations: 2nd degree perineal, left periurethral ?EBL: 125 mL ?Analgesia: Epidural ? ?Infant: Viable female  APGARs 8 and 9  weight pending ? ?SaKarsten FellsDO PGY-1 ?04/16/2021, 3:07 AM ? ?The above was performed under my direct supervision and guidance.  ? ? ?

## 2021-03-02 ENCOUNTER — Ambulatory Visit (INDEPENDENT_AMBULATORY_CARE_PROVIDER_SITE_OTHER): Payer: Medicaid Other | Admitting: Obstetrics and Gynecology

## 2021-03-02 ENCOUNTER — Other Ambulatory Visit: Payer: Self-pay

## 2021-03-02 ENCOUNTER — Encounter: Payer: Self-pay | Admitting: Obstetrics and Gynecology

## 2021-03-02 VITALS — BP 125/81 | HR 87 | Wt 236.1 lb

## 2021-03-02 DIAGNOSIS — O09513 Supervision of elderly primigravida, third trimester: Secondary | ICD-10-CM | POA: Insufficient documentation

## 2021-03-02 DIAGNOSIS — O99210 Obesity complicating pregnancy, unspecified trimester: Secondary | ICD-10-CM

## 2021-03-02 DIAGNOSIS — O09813 Supervision of pregnancy resulting from assisted reproductive technology, third trimester: Secondary | ICD-10-CM

## 2021-03-02 DIAGNOSIS — F3181 Bipolar II disorder: Secondary | ICD-10-CM

## 2021-03-02 DIAGNOSIS — O0990 Supervision of high risk pregnancy, unspecified, unspecified trimester: Secondary | ICD-10-CM

## 2021-03-02 DIAGNOSIS — Z3A31 31 weeks gestation of pregnancy: Secondary | ICD-10-CM

## 2021-03-02 DIAGNOSIS — O285 Abnormal chromosomal and genetic finding on antenatal screening of mother: Secondary | ICD-10-CM

## 2021-03-02 DIAGNOSIS — Z6836 Body mass index (BMI) 36.0-36.9, adult: Secondary | ICD-10-CM

## 2021-03-02 DIAGNOSIS — E039 Hypothyroidism, unspecified: Secondary | ICD-10-CM

## 2021-03-02 DIAGNOSIS — O133 Gestational [pregnancy-induced] hypertension without significant proteinuria, third trimester: Secondary | ICD-10-CM | POA: Insufficient documentation

## 2021-03-02 MED ORDER — LEVOTHYROXINE SODIUM 112 MCG PO TABS: 112.0000 ug | ORAL_TABLET | Freq: Every day | ORAL | 1 refills | Status: DC

## 2021-03-04 ENCOUNTER — Encounter: Payer: Self-pay | Admitting: Certified Nurse Midwife

## 2021-03-04 ENCOUNTER — Encounter: Payer: Self-pay | Admitting: Obstetrics and Gynecology

## 2021-03-04 ENCOUNTER — Ambulatory Visit (HOSPITAL_COMMUNITY)
Admission: RE | Admit: 2021-03-04 | Discharge: 2021-03-04 | Disposition: A | Discharge status: Home / Self Care | Payer: Medicaid Other | Source: Ambulatory Visit | Attending: Family Medicine | Admitting: Family Medicine

## 2021-03-04 ENCOUNTER — Other Ambulatory Visit: Payer: Self-pay

## 2021-03-04 VITALS — BP 146/81 | HR 74 | Temp 97.9°F | Resp 20

## 2021-03-04 DIAGNOSIS — F3181 Bipolar II disorder: Secondary | ICD-10-CM | POA: Insufficient documentation

## 2021-03-04 DIAGNOSIS — J029 Acute pharyngitis, unspecified: Secondary | ICD-10-CM | POA: Diagnosis present

## 2021-03-04 DIAGNOSIS — J069 Acute upper respiratory infection, unspecified: Secondary | ICD-10-CM | POA: Insufficient documentation

## 2021-03-04 DIAGNOSIS — O285 Abnormal chromosomal and genetic finding on antenatal screening of mother: Secondary | ICD-10-CM | POA: Insufficient documentation

## 2021-03-04 LAB — POCT RAPID STREP A, ED / UC: Streptococcus, Group A Screen (Direct): NEGATIVE

## 2021-03-04 NOTE — Discharge Instructions (Addendum)
You were seen today for sore throat.  Your strep test was negative today.  This will be sent for culture and will be resulted in 24-48 hrs.  Your sore throat is likely viral in nature.  I recommend tylenol/motrin for pain, and salt water gargles.  If you develop new/worsening symptoms just as fever, then please return for re-evaluation.

## 2021-03-04 NOTE — ED Triage Notes (Addendum)
Pt reports that had covid 7 days ago. Reports that her husband complained of her snoring during the night. Reports woke up with worse sore throat. Pt states had tonsillectomy at age 41. Pt [redacted] weeks pregnant

## 2021-03-04 NOTE — ED Provider Notes (Signed)
[facility]    CSN: [id number] Arrival date & time: 03/04/21  1245      History   Chief Complaint Chief Complaint  Patient presents with   Sore Throat   appt 1p    HPI Melissa Whitney is a 41 y.o. female.   Patient is here for severe sore throat.  She was snoring all night, and she never snores.  The pain is better, wondering if just dry air.  She does have some PND, she just had covid, and has been covid free x 1 week.  Just pnd, no cough.  No fevers/chills.  No otc meds used.  She is [redacted] weeks pregnant currently.   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   Bipolar II disorder ([place]) 03/04/2021   Supervision of high risk pregnancy, antepartum 03/02/2021   AMA (advanced maternal age) primigravida 88+, third trimester 03/02/2021   Encounter for supervision of pregnancy resulting from assisted reproductive technology in third trimester, antepartum 03/02/2021   Transient hypertension of pregnancy in third trimester 03/02/2021   Hypothyroid 10/01/2018   Smoker 10/01/2018    Past Surgical History:  Procedure Laterality Date   APPENDECTOMY     COLPOSCOPY     GYNECOLOGIC CRYOSURGERY     cervical   TONSILLECTOMY AND ADENOIDECTOMY      OB History     Gravida  1   Para  0   Term  0   Preterm  0   AB  0   Living  0      SAB  0   IAB  0   Ectopic  0   Multiple  0   Live Births  0            Home Medications    Prior to Admission medications   Medication Sig Start Date End Date Taking? Authorizing Provider  Famotidine (PEPCID PO) Take by mouth.    [provider]  IBUPROFEN PO Take by mouth.    [provider]  levothyroxine (SYNTHROID) 112 MCG tablet Take 1 tablet (112 mcg total) by mouth daily before breakfast. 03/02/21   Aletha Halim, MD  medroxyPROGESTERone (PROVERA) 5 MG tablet Take 1 tablet (5 mg total) by mouth daily for 10 days. 02/03/20 02/13/20  Tamela Gammon, NP  Prenatal Vit-Fe Fumarate-FA (PRENATAL VITAMIN PO) Take by mouth.    [provider]  QUEtiapine (SEROQUEL) 25 MG tablet Take 25 mg by mouth at bedtime.    [provider]  sertraline (ZOLOFT) 50 MG tablet Take 50 mg by mouth daily.    [provider]    Family History Family History  Problem Relation Age of Onset   Endometriosis Mother    Endometriosis Sister     Social History Social History   Tobacco Use   Smoking status: Former    Packs/day: 1.00    Types: Cigarettes   Smokeless tobacco: Never  Vaping Use   Vaping Use: Never used  Substance Use Topics   Alcohol use: Not Currently   Drug use: Not Currently     Allergies   Other   Review of Systems Review of Systems  Constitutional: Negative.   HENT:  Positive for postnasal drip and sore throat. Negative for  congestion, ear pain and rhinorrhea.   Respiratory: Negative.    Cardiovascular: Negative.     Physical Exam Triage Vital Signs ED Triage Vitals  Enc Vitals Group     BP 03/04/21 1317 (!) 146/81     Pulse Rate 03/04/21 1317 74     Resp 03/04/21 1317 20     Temp 03/04/21 1317 97.9 F (36.6 C)     Temp Source 03/04/21 1317 Oral     SpO2 03/04/21 1317 97 %     Weight --      Height --      Head Circumference --      Peak Flow --      Pain Score 03/04/21 1316 6     Pain Loc --      Pain Edu? --      Excl. in [place]? --    No data found.  Updated Vital Signs BP (!) 146/81 (BP Location: Left Arm)    Pulse 74    Temp 97.9 F (36.6 C) (Oral)    Resp 20    LMP 07/25/2020    SpO2 97%   Visual Acuity Right Eye Distance:   Left Eye Distance:   Bilateral Distance:    Right Eye Near:   Left Eye Near:    Bilateral Near:     Physical Exam Constitutional:      Appearance: She is well-developed.  HENT:     Head: Normocephalic  and atraumatic.     Right Ear: Tympanic membrane normal.     Left Ear: Tympanic membrane normal.     Nose: No congestion or rhinorrhea.     Mouth/Throat:     Mouth: Mucous membranes are moist.     Pharynx: Posterior oropharyngeal erythema and uvula swelling present. No oropharyngeal exudate.     Tonsils: No tonsillar exudate.  Cardiovascular:     Rate and Rhythm: Normal rate and regular rhythm.  Pulmonary:     Effort: Pulmonary effort is normal.     Breath sounds: Normal breath sounds.  Musculoskeletal:     Cervical back: Normal range of motion and neck supple.  Lymphadenopathy:     Cervical: No cervical adenopathy.  Neurological:     Mental Status: She is alert.     UC Treatments / Results  Labs (all labs ordered are listed, but only abnormal results are displayed) Labs Reviewed  CULTURE, GROUP A STREP [facility])  POCT RAPID STREP A, ED / UC    EKG   Radiology No results found.  Procedures Procedures (including critical care time)  Medications Ordered in UC Medications - No data to display  Initial Impression / Assessment and Plan / UC Course  I have reviewed the triage vital signs and the nursing notes.  Pertinent labs & imaging results that were available during my care of the patient were reviewed by me and considered in my medical decision making (see chart for details).   Patient was seen for sore throat today.  Strep was negative.  I assume this is viral in nature.  Recommend supportive care at this time such as tylenol/motrin/salt water gargles.   Final Clinical Impressions(s) / UC Diagnoses   Final diagnoses:  Acute pharyngitis, unspecified etiology  Upper respiratory tract infection, unspecified type     Discharge Instructions      You were seen today for sore throat.  Your strep test was negative today.  This will be sent for culture and will be resulted in 24-48 hrs.  Your sore throat is likely viral in nature.  I recommend tylenol/motrin for pain,  and salt water gargles.  If you develop new/worsening symptoms just as fever, then please return for re-evaluation.     ED Prescriptions   None    PDMP not reviewed this encounter.   Rondel Oh, MD 03/04/21 1353

## 2021-03-04 NOTE — Progress Notes (Signed)
° °  PRENATAL VISIT NOTE  Transfer of care from [employer] due to insurance change  Subjective:  Melissa Whitney is a 41 y.o. G1at [redacted]w[redacted]d being seen today for ongoing prenatal care.  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  Patient reports no complaints.  Contractions: Irritability. Vag. Bleeding: None.  Movement: Present. Denies leaking of fluid.   The following portions of the patient's history were reviewed and updated as appropriate: allergies, current medications, past family history, past medical history, past social history, past surgical history and problem list.   Objective:   Vitals:   03/02/21 1343 03/02/21 1425  BP: 140/90 125/81  Pulse: 87   Weight: 236 lb 1.6 oz (107.1 kg)     Fetal Status: Fetal Heart Rate (bpm): 137   Movement: Present     General:  Alert, oriented and cooperative. Patient is in no acute distress.  Skin: Skin is warm and dry. No rash noted.   Cardiovascular: Normal heart rate noted  Respiratory: Normal respiratory effort, no problems with respiration noted  Abdomen: Soft, gravid, appropriate for gestational age.  Pain/Pressure: Present     Pelvic: Cervical exam deferred        Extremities: Normal range of motion.  Edema: None  Mental Status: Normal mood and affect. Normal behavior. Normal judgment and thought content.   Assessment and Plan:  Pregnancy: G1P0000 at [redacted]w[redacted]d 1. Supervision of high risk pregnancy, antepartum OB records reviewed. Normal 28wk labs, ultrasounds.   2. Transient hypertension of pregnancy in third trimester Repeat normal  3.  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 Encounter for supervision of pregnancy resulting from assisted reproductive technology in third trimester, antepartum [facility] pregnancy  5. [redacted] weeks gestation of pregnancy  6. Hypothyroidism, unspecified type Continue synthroid. Recent TSH wnl  7. Obesity in pregnancy  8. BMI 36.0-36.9,adult  9. AMA (advanced maternal age) primigravida 14+, third trimester No issues.   10. History of bipolar II Patient stopped seroquel with +UPT. She continues on zoloft.   Preterm labor symptoms and general obstetric precautions including but not limited to vaginal bleeding, contractions, leaking of fluid and fetal movement were reviewed in detail with the patient. Please refer to After Visit Summary for other counseling recommendations.   Return in about 2 weeks (around 03/16/2021) for low risk ob, in person, With midwife to talk about waterbirth.  Future Appointments  Date Time Provider [place]  03/04/2021  1:00 PM [facility] PROVIDER [place] None  03/17/2021  3:35 PM Gabriel Carina, CNM [facility]    Aletha Halim, MD

## 2021-03-06 LAB — CULTURE, GROUP A STREP (THRC)

## 2021-03-11 ENCOUNTER — Other Ambulatory Visit: Payer: Self-pay

## 2021-03-11 ENCOUNTER — Ambulatory Visit (INDEPENDENT_AMBULATORY_CARE_PROVIDER_SITE_OTHER): Payer: Medicaid Other

## 2021-03-11 VITALS — BP 131/83 | HR 75 | Wt 236.5 lb

## 2021-03-11 DIAGNOSIS — Z23 Encounter for immunization: Secondary | ICD-10-CM | POA: Diagnosis not present

## 2021-03-11 DIAGNOSIS — O0990 Supervision of high risk pregnancy, unspecified, unspecified trimester: Secondary | ICD-10-CM

## 2021-03-11 DIAGNOSIS — Z0130 Encounter for examination of blood pressure without abnormal findings: Secondary | ICD-10-CM

## 2021-03-11 NOTE — Progress Notes (Signed)
Pt here today for BP check. Pt is currently pregnant. Pt denies any headaches or swelling, or visual changes. Pt states went to urgent care on 03/04/21 and was found to have an elevated BP of 146/81.   Today's BP is 131/83  Reviewed with Dr Kennon Rounds. Pt advised normal and to keep monitoring BP at home or when has symptoms. Pt sent babyscripts app today and also received her tdap vaccine per pt request.  Pt verbalized understanding and agreeable to plan of care. Pt has next OB appt on 2/8. Pt aware.  Shelda Pal

## 2021-03-11 NOTE — Progress Notes (Signed)
Patient seen and assessed by nursing staff.  Agree with documentation and plan.  

## 2021-03-17 ENCOUNTER — Other Ambulatory Visit: Payer: Self-pay

## 2021-03-17 ENCOUNTER — Ambulatory Visit (INDEPENDENT_AMBULATORY_CARE_PROVIDER_SITE_OTHER): Payer: Medicaid Other | Admitting: Certified Nurse Midwife

## 2021-03-17 VITALS — BP 135/92 | HR 95 | Wt 237.3 lb

## 2021-03-17 DIAGNOSIS — F3181 Bipolar II disorder: Secondary | ICD-10-CM

## 2021-03-17 DIAGNOSIS — O133 Gestational [pregnancy-induced] hypertension without significant proteinuria, third trimester: Secondary | ICD-10-CM

## 2021-03-17 DIAGNOSIS — Z3A33 33 weeks gestation of pregnancy: Secondary | ICD-10-CM

## 2021-03-17 DIAGNOSIS — O0990 Supervision of high risk pregnancy, unspecified, unspecified trimester: Secondary | ICD-10-CM

## 2021-03-17 MED ORDER — SERTRALINE HCL 50 MG PO TABS: 50.0000 mg | ORAL_TABLET | Freq: Every day | ORAL | 6 refills | Status: DC

## 2021-03-17 NOTE — Progress Notes (Signed)
Needs refill of Zoloft. Trace edema in hands , Denies headaches or visual disturbances.  Alfie Alderfer,RN

## 2021-03-17 NOTE — Progress Notes (Signed)
° °  PRENATAL VISIT NOTE  Subjective:  Melissa Whitney is a 41 y.o. G1P0000 at [redacted]w[redacted]d being seen today for ongoing prenatal care.  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  Patient reports no complaints.  Contractions: Irregular. Vag. Bleeding: None.  Movement: Present. Denies leaking of fluid.   The following portions of the patient's history were reviewed and updated as appropriate: allergies, current medications, past family history, past medical history, past social history, past surgical history and problem list.   Objective:   Vitals:   03/17/21 1541 03/17/21 1616  BP: (!) 133/95 (!) 135/92  Pulse: 85 95  Weight: 237 lb 4.8 oz (107.6 kg)     Fetal Status:     Movement: Present     General:  Alert, oriented and cooperative. Patient is in no acute distress.  Skin: Skin is warm and dry. No rash noted.   Cardiovascular: Normal heart rate noted  Respiratory: Normal respiratory effort, no problems with respiration noted  Abdomen: Soft, gravid, appropriate for gestational age.  Pain/Pressure: Absent     Pelvic: Cervical exam deferred        Extremities: Normal range of motion.  Edema: Trace  Mental Status: Normal mood and affect. Normal behavior. Normal judgment and thought content.   Assessment and Plan:  Pregnancy: G1P0000 at [redacted]w[redacted]d 1. Supervision of high risk pregnancy, antepartum - Doing well, feeling regular and vigorous fetal movement   2. [redacted] weeks gestation of pregnancy - Routine OB care   3. Gestational hypertension, third trimester - Meets criteria as  of today, first elevated pressure of 140/90 on 03/02/21. - Explained how this affects the rest of her care including increased surveillance by MFM, increased NST/BPP and IOL by 37-38wks depending on how well controlled her BP is and if she is showing s/sx of PEC. Pt expressed disappointment/anxiety but is understanding. - Discussed why cHTN is treated differently than gHTN, but reviewed ways to help control her BP including increased protein intake/hdyration and stress modulation. Can potentially push IOL if BP normal, labs stable and antenatal testing normal.  - Pt initially interested in waterbirth, asked if she should still take the class. Counseled carefully about how the nature of waterbirth at [facility] and how difficult it can be to make it into the tub during a 37-38wk IOL. Pt understanding and decided against pursuing waterbirth. Encouraged her to practice other pain relief techniques and reviewed other methods of pain management. - Protein / creatinine ratio, urine - CBC - Comp Met (CMET)  4. Bipolar II disorder ([facility]) - sertraline (ZOLOFT) 50 MG tablet; Take 1 tablet (50 mg total) by mouth daily.  Dispense: 30 tablet; Refill: 6  Preterm labor symptoms and general obstetric precautions including but not limited to vaginal bleeding, contractions, leaking of fluid and fetal movement were reviewed in detail with the patient. Please refer to After Visit Summary for other counseling recommendations.   Return in about 2 weeks (around 03/31/2021) for IN-PERSON, LOB.  Future Appointments  Date Time Provider [place]  03/31/2021  4:15 PM Helaine Chess [facility]    Gabriel Carina, CNM

## 2021-03-18 ENCOUNTER — Encounter: Payer: Self-pay | Admitting: Certified Nurse Midwife

## 2021-03-18 LAB — COMPREHENSIVE METABOLIC PANEL
ALT: 11 IU/L (ref 0–32)
AST: 14 IU/L (ref 0–40)
Albumin/Globulin Ratio: 1.5 (ref 1.2–2.2)
Albumin: 3.7 g/dL — ABNORMAL LOW (ref 3.8–4.8)
Alkaline Phosphatase: 145 IU/L — ABNORMAL HIGH (ref 44–121)
BUN/Creatinine Ratio: 9 (ref 9–23)
BUN: 6 mg/dL (ref 6–20)
Bilirubin Total: 0.2 mg/dL (ref 0.0–1.2)
CO2: 20 mmol/L (ref 20–29)
Calcium: 9.4 mg/dL (ref 8.7–10.2)
Chloride: 106 mmol/L (ref 96–106)
Creatinine, Ser: 0.64 mg/dL (ref 0.57–1.00)
Globulin, Total: 2.5 g/dL (ref 1.5–4.5)
Glucose: 106 mg/dL — ABNORMAL HIGH (ref 70–99)
Potassium: 4 mmol/L (ref 3.5–5.2)
Sodium: 141 mmol/L (ref 134–144)
Total Protein: 6.2 g/dL (ref 6.0–8.5)
eGFR: 116 mL/min/{1.73_m2} (ref >59)

## 2021-03-18 LAB — CBC
Hematocrit: 38.7 % (ref 34.0–46.6)
Hemoglobin: 13.2 g/dL (ref 11.1–15.9)
MCH: 27.5 pg (ref 26.6–33.0)
MCHC: 34.1 g/dL (ref 31.5–35.7)
MCV: 81 fL (ref 79–97)
Platelets: 337 10*3/uL (ref 150–450)
RBC: 4.8 x10E6/uL (ref 3.77–5.28)
RDW: 12.8 % (ref 11.7–15.4)
WBC: 10.6 10*3/uL (ref 3.4–10.8)

## 2021-03-18 LAB — PROTEIN / CREATININE RATIO, URINE
Creatinine, Urine: 73.2 mg/dL
Protein, Ur: 8.4 mg/dL
Protein/Creat Ratio: 115 mg/g creat (ref 0–200)

## 2021-03-24 ENCOUNTER — Ambulatory Visit (INDEPENDENT_AMBULATORY_CARE_PROVIDER_SITE_OTHER): Payer: Medicaid Other

## 2021-03-24 ENCOUNTER — Other Ambulatory Visit: Payer: Self-pay

## 2021-03-24 VITALS — BP 132/88 | HR 80

## 2021-03-24 DIAGNOSIS — Z0130 Encounter for examination of blood pressure without abnormal findings: Secondary | ICD-10-CM

## 2021-03-24 NOTE — Progress Notes (Signed)
Pt here today after speaking with RN on phone that she took her BP at home and it was 152/94 with no headaches or visual changes.  Pt was also concerned about her husband going out of town for 5 days.  BP RA 132/88 FHR 152.  I advised pt to continue to monitor her BP's and that today's BP will count for her weekly BP in Babyscripts.  I also requested that the pt bring in her BP cuff to her next appt so that we can make sure that everything is okay with it.  Pt verbalized understanding with no further questions or concerns.   Mel Almond, RN  03/24/21

## 2021-03-26 ENCOUNTER — Encounter: Payer: Self-pay | Admitting: Certified Nurse Midwife

## 2021-03-29 ENCOUNTER — Encounter: Payer: Self-pay | Admitting: Certified Nurse Midwife

## 2021-03-31 ENCOUNTER — Ambulatory Visit (INDEPENDENT_AMBULATORY_CARE_PROVIDER_SITE_OTHER): Payer: Medicaid Other | Admitting: Certified Nurse Midwife

## 2021-03-31 ENCOUNTER — Other Ambulatory Visit: Payer: Self-pay

## 2021-03-31 VITALS — BP 129/91 | HR 76 | Wt 240.0 lb

## 2021-03-31 DIAGNOSIS — O0993 Supervision of high risk pregnancy, unspecified, third trimester: Secondary | ICD-10-CM

## 2021-03-31 DIAGNOSIS — O133 Gestational [pregnancy-induced] hypertension without significant proteinuria, third trimester: Secondary | ICD-10-CM

## 2021-03-31 DIAGNOSIS — Z3A35 35 weeks gestation of pregnancy: Secondary | ICD-10-CM

## 2021-04-02 ENCOUNTER — Telehealth (HOSPITAL_COMMUNITY): Payer: Self-pay | Admitting: *Deleted

## 2021-04-02 NOTE — Telephone Encounter (Signed)
Preadmission screen  

## 2021-04-04 DIAGNOSIS — O139 Gestational [pregnancy-induced] hypertension without significant proteinuria, unspecified trimester: Secondary | ICD-10-CM | POA: Insufficient documentation

## 2021-04-04 NOTE — Progress Notes (Signed)
° °  PRENATAL VISIT NOTE  Subjective:  Melissa Whitney is a 41 y.o. G1P0000 at [redacted]w[redacted]d being seen today for ongoing prenatal care.  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  Patient reports no complaints.  Contractions: Not present. Vag. Bleeding: None.  Movement: Present. Denies leaking of fluid.   The following portions of the patient's history were reviewed and updated as appropriate: allergies, current medications, past family history, past medical history, past social history, past surgical history and problem list.   Objective:   Vitals:   03/31/21 1557 03/31/21 1620  BP: 137/90 (!) 129/91  Pulse: 79 76  Weight: 240 lb (108.9 kg)     Fetal Status: Fetal Heart Rate (bpm): 142   Movement: Present  Presentation: Vertex  General:  Alert, oriented and cooperative. Patient is in no acute distress.  Skin: Skin is warm and dry. No rash noted.   Cardiovascular: Normal heart rate noted  Respiratory: Normal respiratory effort, no problems with respiration noted  Abdomen: Soft, gravid, appropriate for gestational age.  Pain/Pressure: Absent     Pelvic: Cervical exam deferred        Extremities: Normal range of motion.     Mental Status: Normal mood and affect. Normal behavior. Normal judgment and thought content.   Assessment and Plan:  Pregnancy: G1P0000 at [redacted]w[redacted]d 1. Supervision of high risk pregnancy in third trimester - Doing well, feeling regular and vigorous fetal movement   2. [redacted] weeks gestation of pregnancy - Routine OB care including anticipatory guidance re GBS at  next visit  3. Gestational hypertension, third trimester - Meets criteria with today's hypertensive reading. No s/sx of pre-e so did not repeat labs. Strong PEC precautions given and discussed recommendation for IOL at 37wks. Pt amenable to plan. - IOL scheduled for 04/13/21, orders placed.  Preterm labor symptoms and general obstetric precautions including but not limited to vaginal bleeding, contractions, leaking of fluid and fetal movement were reviewed in detail with the patient. Please refer to After Visit Summary for other counseling recommendations.   Return in about 1 week (around 04/07/2021) for IN-PERSON, LOB.  Future Appointments  Date Time Provider [place]  04/09/2021 11:15 AM Kandace Blitz [facility]  04/13/2021 12:00 AM [facility] [place] None    Gabriel Carina, CNM

## 2021-04-05 ENCOUNTER — Telehealth (HOSPITAL_COMMUNITY): Payer: Self-pay | Admitting: *Deleted

## 2021-04-05 NOTE — Telephone Encounter (Signed)
Preadmission screen  

## 2021-04-06 ENCOUNTER — Telehealth (HOSPITAL_COMMUNITY): Payer: Self-pay | Admitting: *Deleted

## 2021-04-06 NOTE — Telephone Encounter (Signed)
Preadmission screen  

## 2021-04-07 ENCOUNTER — Telehealth (HOSPITAL_COMMUNITY): Payer: Self-pay | Admitting: *Deleted

## 2021-04-07 ENCOUNTER — Other Ambulatory Visit: Payer: Self-pay | Admitting: Advanced Practice Midwife

## 2021-04-07 NOTE — Telephone Encounter (Signed)
Preadmission screen  

## 2021-04-08 ENCOUNTER — Telehealth (HOSPITAL_COMMUNITY): Payer: Self-pay | Admitting: *Deleted

## 2021-04-08 ENCOUNTER — Encounter (HOSPITAL_COMMUNITY): Payer: Self-pay | Admitting: *Deleted

## 2021-04-08 NOTE — Telephone Encounter (Signed)
Preadmission screen  

## 2021-04-09 ENCOUNTER — Other Ambulatory Visit: Payer: Self-pay

## 2021-04-09 ENCOUNTER — Ambulatory Visit (INDEPENDENT_AMBULATORY_CARE_PROVIDER_SITE_OTHER): Payer: Medicaid Other | Admitting: Family

## 2021-04-09 ENCOUNTER — Other Ambulatory Visit (HOSPITAL_COMMUNITY)
Admission: RE | Admit: 2021-04-09 | Discharge: 2021-04-09 | Disposition: A | Discharge status: Home / Self Care | Payer: Medicaid Other | Source: Ambulatory Visit | Attending: Family | Admitting: Family

## 2021-04-09 VITALS — BP 156/107 | HR 81 | Wt 243.0 lb

## 2021-04-09 DIAGNOSIS — O0990 Supervision of high risk pregnancy, unspecified, unspecified trimester: Secondary | ICD-10-CM | POA: Insufficient documentation

## 2021-04-09 DIAGNOSIS — O133 Gestational [pregnancy-induced] hypertension without significant proteinuria, third trimester: Secondary | ICD-10-CM | POA: Diagnosis not present

## 2021-04-09 NOTE — Progress Notes (Addendum)
? ?  PRENATAL VISIT NOTE ? ?Subjective:  ?Melissa Whitney is a 41 y.o. G1P0000 at [redacted]w[redacted]d being seen today for ongoing prenatal care.  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 ? ?Patient reports no complaints.  Denies headache, vision changes, and epigastric pain.    Contractions: Irritability. Vag. Bleeding: None.  Movement: Present. Denies leaking of fluid.  ? ?The following portions of the patient's history were reviewed and updated as appropriate: allergies, current medications, past family history, past medical history, past social history, past surgical history and problem list.  ? ?Objective:  ? ?Vitals:  ? 04/09/21 1154  ?BP: (!) 137/101  ?Pulse: 92  ?Weight: 243 lb (110.2 kg)  ? ? ?Fetal Status: Fetal Heart Rate (bpm): 150   Movement: Present   NST reactive ?General:  Alert, oriented and cooperative. Patient is in no acute distress.  ?Skin: Skin is warm and dry. No rash noted.   ?Cardiovascular: Normal heart rate noted  ?Respiratory: Normal respiratory effort, no problems with respiration noted  ?Abdomen: Soft, gravid, appropriate for gestational age.  Pain/Pressure: Present     ?Pelvic: Cervical exam performed in the presence of a chaperone      cl/thick/long  ?Extremities: Normal range of motion.  Edema: Trace  ?Mental Status: Normal mood and affect. Normal behavior. Normal judgment and thought content.  ? ?Assessment and Plan:  ?Pregnancy: G1P0000 at [redacted]w[redacted]d ?1. Supervision of high risk pregnancy, antepartum ?- Culture, beta strep (group b only) ?- GC/Chlamydia probe  amp ([place])not at [facility] ? ?2. Gestational hypertension, third trimester ?- Scheduled for induction on 04/13/21 ?- CBC ?- Comp Met (CMET) ?- Protein / creatinine ratio, urine ?- NST today ?- Consulted with Dr. Kennon Rounds > agrees with plan ? ?Preterm labor and preX symptoms and general obstetric precautions including but not limited to vaginal bleeding, contractions, leaking of fluid and fetal movement were reviewed in detail with the patient. ?Please refer to After Visit Summary for other counseling recommendations.  ? ? ?Future Appointments  ?Date Time Provider [place]  ?04/13/2021 12:00 AM [facility] SCHED ROOM [facility] None  ? ? ?Venia Carbon Karmen Stabs, CNM ?

## 2021-04-10 LAB — CBC
Hematocrit: 37.9 % (ref 34.0–46.6)
Hemoglobin: 12.9 g/dL (ref 11.1–15.9)
MCH: 27.4 pg (ref 26.6–33.0)
MCHC: 34 g/dL (ref 31.5–35.7)
MCV: 81 fL (ref 79–97)
Platelets: 305 10*3/uL (ref 150–450)
RBC: 4.71 x10E6/uL (ref 3.77–5.28)
RDW: 13.1 % (ref 11.7–15.4)
WBC: 10 10*3/uL (ref 3.4–10.8)

## 2021-04-10 LAB — COMPREHENSIVE METABOLIC PANEL WITH GFR
ALT: 14 [IU]/L (ref 0–32)
AST: 18 [IU]/L (ref 0–40)
Albumin/Globulin Ratio: 1.5 (ref 1.2–2.2)
Albumin: 3.5 g/dL — ABNORMAL LOW (ref 3.8–4.8)
Alkaline Phosphatase: 145 [IU]/L — ABNORMAL HIGH (ref 44–121)
BUN/Creatinine Ratio: 10 (ref 9–23)
BUN: 6 mg/dL (ref 6–20)
Bilirubin Total: <0.2 mg/dL (ref 0.0–1.2)
CO2: 17 mmol/L — ABNORMAL LOW (ref 20–29)
Calcium: 8.8 mg/dL (ref 8.7–10.2)
Chloride: 104 mmol/L (ref 96–106)
Creatinine, Ser: 0.61 mg/dL (ref 0.57–1.00)
Globulin, Total: 2.4 g/dL (ref 1.5–4.5)
Glucose: 88 mg/dL (ref 70–99)
Potassium: 3.9 mmol/L (ref 3.5–5.2)
Sodium: 136 mmol/L (ref 134–144)
Total Protein: 5.9 g/dL — ABNORMAL LOW (ref 6.0–8.5)
eGFR: 117 mL/min/{1.73_m2}

## 2021-04-10 LAB — PROTEIN / CREATININE RATIO, URINE
Creatinine, Urine: 97.8 mg/dL
Protein, Ur: 9.5 mg/dL
Protein/Creat Ratio: 97 mg/g creat (ref 0–200)

## 2021-04-12 ENCOUNTER — Encounter: Payer: Self-pay | Admitting: Family

## 2021-04-12 ENCOUNTER — Other Ambulatory Visit: Payer: Self-pay

## 2021-04-12 LAB — GC/CHLAMYDIA PROBE AMP (~~LOC~~) NOT AT ARMC
Chlamydia: NEGATIVE
Comment: NEGATIVE
Comment: NORMAL
Neisseria Gonorrhea: NEGATIVE

## 2021-04-13 ENCOUNTER — Inpatient Hospital Stay (HOSPITAL_COMMUNITY)
Admission: AD | Admit: 2021-04-13 | Discharge: 2021-04-17 | DRG: 806 | Disposition: A | Discharge status: Home / Self Care | Payer: Medicaid Other | Attending: Obstetrics and Gynecology | Admitting: Obstetrics and Gynecology

## 2021-04-13 ENCOUNTER — Encounter (HOSPITAL_COMMUNITY): Payer: Self-pay | Admitting: Family Medicine

## 2021-04-13 ENCOUNTER — Inpatient Hospital Stay (HOSPITAL_COMMUNITY): Payer: Medicaid Other

## 2021-04-13 DIAGNOSIS — F3181 Bipolar II disorder: Secondary | ICD-10-CM | POA: Diagnosis present

## 2021-04-13 DIAGNOSIS — O134 Gestational [pregnancy-induced] hypertension without significant proteinuria, complicating childbirth: Principal | ICD-10-CM | POA: Diagnosis present

## 2021-04-13 DIAGNOSIS — O09813 Supervision of pregnancy resulting from assisted reproductive technology, third trimester: Secondary | ICD-10-CM

## 2021-04-13 DIAGNOSIS — O99284 Endocrine, nutritional and metabolic diseases complicating childbirth: Secondary | ICD-10-CM | POA: Diagnosis present

## 2021-04-13 DIAGNOSIS — Z3A37 37 weeks gestation of pregnancy: Secondary | ICD-10-CM

## 2021-04-13 DIAGNOSIS — O99344 Other mental disorders complicating childbirth: Secondary | ICD-10-CM | POA: Diagnosis present

## 2021-04-13 DIAGNOSIS — Z87891 Personal history of nicotine dependence: Secondary | ICD-10-CM

## 2021-04-13 DIAGNOSIS — O09513 Supervision of elderly primigravida, third trimester: Secondary | ICD-10-CM | POA: Diagnosis present

## 2021-04-13 DIAGNOSIS — Z20822 Contact with and (suspected) exposure to covid-19: Secondary | ICD-10-CM | POA: Diagnosis present

## 2021-04-13 DIAGNOSIS — E039 Hypothyroidism, unspecified: Secondary | ICD-10-CM | POA: Diagnosis present

## 2021-04-13 DIAGNOSIS — Z23 Encounter for immunization: Secondary | ICD-10-CM | POA: Diagnosis not present

## 2021-04-13 DIAGNOSIS — O139 Gestational [pregnancy-induced] hypertension without significant proteinuria, unspecified trimester: Secondary | ICD-10-CM | POA: Diagnosis present

## 2021-04-13 DIAGNOSIS — O0990 Supervision of high risk pregnancy, unspecified, unspecified trimester: Secondary | ICD-10-CM | POA: Insufficient documentation

## 2021-04-13 DIAGNOSIS — Z148 Genetic carrier of other disease: Secondary | ICD-10-CM

## 2021-04-13 DIAGNOSIS — O285 Abnormal chromosomal and genetic finding on antenatal screening of mother: Secondary | ICD-10-CM | POA: Diagnosis present

## 2021-04-13 LAB — CBC
HCT: 38 % (ref 36.0–46.0)
Hemoglobin: 12.8 g/dL (ref 12.0–15.0)
MCH: 27.1 pg (ref 26.0–34.0)
MCHC: 33.7 g/dL (ref 30.0–36.0)
MCV: 80.5 fL (ref 80.0–100.0)
Platelets: 332 10*3/uL (ref 150–400)
RBC: 4.72 MIL/uL (ref 3.87–5.11)
RDW: 13.5 % (ref 11.5–15.5)
WBC: 11.8 10*3/uL — ABNORMAL HIGH (ref 4.0–10.5)
nRBC: 0 % (ref 0.0–0.2)

## 2021-04-13 LAB — COMPREHENSIVE METABOLIC PANEL
ALT: 17 U/L (ref 0–44)
AST: 20 U/L (ref 15–41)
Albumin: 2.6 g/dL — ABNORMAL LOW (ref 3.5–5.0)
Alkaline Phosphatase: 121 U/L (ref 38–126)
Anion gap: 9 (ref 5–15)
BUN: 8 mg/dL (ref 6–20)
CO2: 19 mmol/L — ABNORMAL LOW (ref 22–32)
Calcium: 9.2 mg/dL (ref 8.9–10.3)
Chloride: 107 mmol/L (ref 98–111)
Creatinine, Ser: 0.73 mg/dL (ref 0.44–1.00)
GFR, Estimated: >60 mL/min (ref >60)
Glucose, Bld: 99 mg/dL (ref 70–99)
Potassium: 3.4 mmol/L — ABNORMAL LOW (ref 3.5–5.1)
Sodium: 135 mmol/L (ref 135–145)
Total Bilirubin: 0.2 mg/dL — ABNORMAL LOW (ref 0.3–1.2)
Total Protein: 6.1 g/dL — ABNORMAL LOW (ref 6.5–8.1)

## 2021-04-13 LAB — TYPE AND SCREEN
ABO/RH(D): A POS
Antibody Screen: NEGATIVE

## 2021-04-13 LAB — CULTURE, BETA STREP (GROUP B ONLY): Strep Gp B Culture: NEGATIVE

## 2021-04-13 LAB — RPR: RPR Ser Ql: NONREACTIVE

## 2021-04-13 LAB — RESP PANEL BY RT-PCR (FLU A&B, COVID) ARPGX2
Influenza A by PCR: NEGATIVE
Influenza B by PCR: NEGATIVE
SARS Coronavirus 2 by RT PCR: NEGATIVE

## 2021-04-13 LAB — PROTEIN / CREATININE RATIO, URINE
Creatinine, Urine: 152.91 mg/dL
Protein Creatinine Ratio: 0.09 mg/mg{Cre} (ref 0.00–0.15)
Total Protein, Urine: 13 mg/dL

## 2021-04-13 MED ORDER — SOD CITRATE-CITRIC ACID 500-334 MG/5ML PO SOLN
30.0000 mL | ORAL | Status: DC | PRN
  Administered 2021-04-13: 30 mL via ORAL
  Filled 2021-04-13: qty 30

## 2021-04-13 MED ORDER — LEVOTHYROXINE SODIUM 112 MCG PO TABS
112.0000 ug | ORAL_TABLET | Freq: Every day | ORAL | Status: DC
  Administered 2021-04-13 – 2021-04-15 (×3): 112 ug via ORAL
  Filled 2021-04-13 (×5): qty 1

## 2021-04-13 MED ORDER — OXYTOCIN-SODIUM CHLORIDE 30-0.9 UT/500ML-% IV SOLN
2.5000 [IU]/h | INTRAVENOUS | Status: DC
  Filled 2021-04-13: qty 500

## 2021-04-13 MED ORDER — TERBUTALINE SULFATE 1 MG/ML IJ SOLN: 0.2500 mg | Freq: Once | INTRAMUSCULAR | Status: DC | PRN

## 2021-04-13 MED ORDER — OXYCODONE-ACETAMINOPHEN 5-325 MG PO TABS: 1.0000 | ORAL_TABLET | ORAL | Status: DC | PRN

## 2021-04-13 MED ORDER — LACTATED RINGERS IV SOLN
500.0000 mL | INTRAVENOUS | Status: DC | PRN
  Administered 2021-04-15: 18:00:00 500 mL via INTRAVENOUS

## 2021-04-13 MED ORDER — OXYCODONE-ACETAMINOPHEN 5-325 MG PO TABS: 2.0000 | ORAL_TABLET | ORAL | Status: DC | PRN

## 2021-04-13 MED ORDER — HYDROXYZINE HCL 25 MG PO TABS
25.0000 mg | ORAL_TABLET | Freq: Three times a day (TID) | ORAL | Status: DC | PRN
  Administered 2021-04-13: 25 mg via ORAL
  Filled 2021-04-13: qty 1

## 2021-04-13 MED ORDER — OXYTOCIN BOLUS FROM INFUSION
333.0000 mL | Freq: Once | INTRAVENOUS | Status: AC
  Administered 2021-04-16: 333 mL via INTRAVENOUS

## 2021-04-13 MED ORDER — LACTATED RINGERS IV SOLN: INTRAVENOUS | Status: DC

## 2021-04-13 MED ORDER — LIDOCAINE HCL (PF) 1 % IJ SOLN
30.0000 mL | INTRAMUSCULAR | Status: DC | PRN
  Filled 2021-04-13: qty 30

## 2021-04-13 MED ORDER — ACETAMINOPHEN 325 MG PO TABS
650.0000 mg | ORAL_TABLET | ORAL | Status: DC | PRN
  Administered 2021-04-13 (×2): 650 mg via ORAL
  Filled 2021-04-13 (×4): qty 2

## 2021-04-13 MED ORDER — SERTRALINE HCL 50 MG PO TABS
50.0000 mg | ORAL_TABLET | Freq: Every day | ORAL | Status: DC
Start: 2021-04-13 — End: 2021-04-16
  Administered 2021-04-13 – 2021-04-15 (×3): 50 mg via ORAL
  Filled 2021-04-13 (×5): qty 1

## 2021-04-13 MED ORDER — ONDANSETRON HCL 4 MG/2ML IJ SOLN: 4.0000 mg | Freq: Four times a day (QID) | INTRAMUSCULAR | Status: DC | PRN

## 2021-04-13 MED ORDER — MISOPROSTOL 50MCG HALF TABLET
50.0000 ug | ORAL_TABLET | ORAL | Status: DC | PRN
  Administered 2021-04-13 (×5): 50 ug via BUCCAL
  Filled 2021-04-13 (×5): qty 1

## 2021-04-13 MED ORDER — FLEET ENEMA 7-19 GM/118ML RE ENEM: 1.0000 | ENEMA | RECTAL | Status: DC | PRN

## 2021-04-13 MED ORDER — MISOPROSTOL 25 MCG QUARTER TABLET
25.0000 ug | ORAL_TABLET | ORAL | Status: DC | PRN
  Administered 2021-04-13 – 2021-04-14 (×3): 25 ug via VAGINAL
  Filled 2021-04-13 (×3): qty 1

## 2021-04-13 MED ORDER — FENTANYL CITRATE (PF) 100 MCG/2ML IJ SOLN
50.0000 ug | INTRAMUSCULAR | Status: DC | PRN
  Administered 2021-04-14 – 2021-04-15 (×3): 100 ug via INTRAVENOUS
  Filled 2021-04-13 (×3): qty 2

## 2021-04-13 NOTE — Progress Notes (Signed)
Labor Progress Note ?Melissa Whitney is a 41 y.o. G1P0000 at 57w3dpresented for IOL for gHTN ? ?S:  ?Feeling more ctx, some wrapping from back. ? ?O:  ?BP (!) 150/94   Pulse 80   Temp (!) 97.5 ?F (36.4 ?C) (Axillary)   Resp 20   Ht 5' 7.5" (1.715 m)   Wt 111.6 kg   LMP 07/25/2020   BMI 37.96 kg/m?  ?EFM: baseline 135 bpm/ mod variability/ + accels/ no decels  ?Toco/IUPC: UI ?SVE: Dilation: Closed ?Effacement (%): Thick ?Cervical Position: Posterior ?Station: -3 ?Presentation: Vertex ?Exam by:: MColman Cater? ? ?A/P: 41y.o. G1P0000 369w3d?1. Labor: latent ?2. FWB: Cat I ?3. Pain: analgesia/anesthesia/NO prn ?4. gHTN: stable ? ?Continue Cytotec, place FB when able. Anticipate labor progress and SVD. ? ?MeJulianne HandlerCNM ?6:33 PM ? ? ?

## 2021-04-13 NOTE — Progress Notes (Signed)
Labor Progress Note ?Melissa Whitney is a 41 y.o. G1P0000 at 80w3dpresented for IOL due to gHTN. ? ?S: Resting in bed, reporting mild discomfort with cramping.  ? ?O:  ?BP 131/80   Pulse 87   Temp 98 ?F (36.7 ?C) (Oral)   Resp 16   Ht 5' 7.5" (1.715 m)   Wt 111.6 kg   LMP 07/25/2020   BMI 37.96 kg/m?  ?EFM: Baseline FHR 120 bpm/moderate variability/+accels, no decels ? ?CVE: Dilation: Fingertip ?Effacement (%): Thick ?Cervical Position: Posterior ?Station: Ballotable ?Presentation: Vertex ?Exam by:: HTona Sensing MD ? ? ?A&P: 41y.o. G1P0000 369w3d?#Labor: SVE largely unchanged. S/p cytotec x1. Will continue with additional buccal cytotec. Consider foley balloon placement when able. Will reassess in 4 hours. ?#Pain: PRN ?#FWB: Cat 1 ?#GBS negative ? ?#gHTN: Mild range BP, improving. Remains asymptomatic. Will continue to monitor.  ? ?SaKarsten FellsDO PGY-1 ?04/13/2021, 5:33 AM ? ? ?

## 2021-04-13 NOTE — Progress Notes (Addendum)
Labor Progress Note ?Melissa Whitney is a 41 y.o. G1P0000 at 50w3dpresented for IOL due to gHTN. ? ?S: Resting comfortably in bed. Feeling intermittent cramping along with some contractions.  ? ?O:  ?BP 124/82   Pulse 71   Temp 97.6 ?F (36.4 ?C) (Oral)   Resp 18   Ht 5' 7.5" (1.715 m)   Wt 111.6 kg   LMP 07/25/2020   BMI 37.96 kg/m?  ?EFM: Baseline FHR 130 bpm/moderate variability/+accels, no decels ? ?SVE: cl/th/very posterior, vtx not in pelvis, vtx confirmed by u/s earlier in day ? ?A&P: 41y.o. G1P0000 366w3d?#Labor: SVE largely unchanged. S/p buccal cytotec x5. Will change to vaginal cytotec. Plan to place FB when able. Will reassess in 4 hours. ?#Pain: Labor support without medications; PRN if necessary ?#FWB: Cat 1 ?#GBS negative ? ?#gHTN: Stable, mild range BP. Remains asymptomatic. Will continue to monitor. ? ?SaKarsten FellsDO PGY-1 ?04/13/2021, 10:19 PM ? ?I spoke with and examined patient and agree with resident/PA-S/MS/SNM's note and plan of care. Denies ha, visual changes, ruq/epigastric pain, n/v.  Change to vaginal cytotec, attempt foley bulb when able ?KiRoma SchanzCNM, WHNP-BC ?04/13/2021 ?10:34 PM  ?

## 2021-04-13 NOTE — H&P (Addendum)
OBSTETRIC ADMISSION HISTORY AND PHYSICAL  Melissa Whitney is a 41 y.o. female G1P0000 with IUP at 24w3dby LMP/8-wk [place] for I[place] due to gHTN. She reports +FMs, no LOF, no VB, no blurry vision, headaches, peripheral edema, or RUQ pain.  She plans on breast feeding. She is undecided about postpartum contraception but desires non-hormonal options.  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  Past Medical History: Past Medical History:  Diagnosis Date   Dysmenorrhea    Mood disorder ([place]    Pregnancy induced hypertension    Smoker 10/01/2018   Quit 06/18/2019   Thyroid disease    hypothyroid   Toxic maculopathy    Vaginal Pap smear, abnormal     Past Surgical History: Past Surgical History:  Procedure Laterality Date   APPENDECTOMY     COLPOSCOPY     GYNECOLOGIC CRYOSURGERY     cervical   TONSILLECTOMY AND ADENOIDECTOMY      Obstetrical History: OB History     Gravida  1   Para  0   Term  0   Preterm  0   AB  0   Living  0      SAB  0   IAB  0   Ectopic  0   Multiple  0   Live Births  0           Social History Social History   Socioeconomic History   Marital status: Married    Spouse name: Not on file   Number of children: Not on file   Years of education: Not on file   Highest education level: Not on file  Occupational History   Not on file  Tobacco Use   Smoking status: Former    Packs/day: 1.00    Types: Cigarettes    Quit date: 06/2019    Years since quitting: 1.8   Smokeless tobacco: Never  Vaping Use   Vaping Use: Never used  Substance and Sexual Activity   Alcohol use: Not Currently    Comment:  3 years   Drug use: Not Currently   Sexual activity: Yes    Birth control/protection: I.U.D.    Comment: Mirena inserted 10/08/2018-1st intercourse 41yo-More than 5 partners  Other Topics Concern   Not on file  Social History Narrative   Not on file   Social Determinants of Health   Financial Resource Strain: Not on file  Food Insecurity: No Food Insecurity   Worried About RCharity fundraiserin the Last Year: Never true   Ran Out of Food in the Last Year: Never true  Transportation Needs: No Transportation Needs   Lack of Transportation (Medical): No   Lack of Transportation (Non-Medical): No  Physical Activity: Not on file  Stress: Not on file  Social Connections: Not on file    Family History: Family History  Problem Relation Age of Onset   Thyroid disease Mother    Endometriosis Mother    Heart disease Father    Heart attack Father    Arthritis Sister        psoriatic   Thyroid disease Sister    Endometriosis Sister    Thyroid disease Maternal  Grandmother     Allergies: Allergies  Allergen Reactions   Other     Poppy Seed    Medications Prior to Admission  Medication Sig Dispense Refill Last Dose   levothyroxine (SYNTHROID) 112 MCG tablet Take 1 tablet (112 mcg total) by mouth daily before breakfast. 30 tablet 1 04/12/2021 at 0800   Prenatal Vit-Fe Fumarate-FA (PRENATAL VITAMIN PO) Take by mouth.   04/12/2021 at 2300   sertraline (ZOLOFT) 50 MG tablet Take 1 tablet (50 mg total) by mouth daily. 30 tablet 6 04/12/2021 at 0800    Review of Systems  All systems reviewed and negative except as stated in HPI  Blood pressure (!) 150/102, pulse 88, temperature 98 F (36.7 C), temperature source Oral, resp. rate 16, height 5' 7.5" (1.715 m), weight 111.6 kg, last menstrual period 07/25/2020.  General appearance: alert, cooperative, and no distress Lungs: normal work of breathing on room air Heart: normal rate, warm and well-perfused Abdomen: soft, non-tender;  gravid Extremities: no LE edema, no calf tenderness to palpation  Presentation: Cephalic Fetal monitoring: Baseline FHR 125 bpm/moderate variability/+accels, no decels Uterine activity: None Dilation: Closed Effacement (%): Thick Station: Ballotable Exam by::  Tona Sensing, MD)   Prenatal labs: ABO, Rh: --/--/A POS (03/07 0025) Antibody: NEG (03/07 0025) Rubella: Immune, Immune (08/30 0000) RPR: Nonreactive (08/30 0000)  HBsAg: Negative (08/30 0000)  HIV: Non-reactive (08/30 0000)  GBS: Negative/-- (03/03 1240) 1 hr Glucola: Normal Genetic screening: LR NIPS, [place] - SMA carrier (FOB negative screen 10/29/20) Anatomy US: Normal at [employer]  Prenatal Transfer Tool  Maternal Diabetes: No Genetic Screening: LR cell free DNA; Maternal carrier for SMA (FOB negative) Maternal Ultrasounds/Referrals: Normal Fetal Ultrasounds or other Referrals:  None Maternal Substance Abuse:  No Significant Maternal Medications:  Meds include: Other: Levothyroxine 112 mcg; Zoloft 50 mg  Significant Maternal Lab Results: GBS unknown; culture pending  Results for orders placed or performed during the hospital encounter of 04/13/21 (from the past 24 hour(s))  Type and screen   Collection Time: 04/13/21 12:25 AM  Result Value Ref Range   ABO/RH(D) A POS    Antibody Screen NEG    Sample Expiration      04/16/2021,2359 Performed at [facility] [address] [place], [place] [address]   CBC   Collection Time: 04/13/21 12:26 AM  Result Value Ref Range   WBC 11.8 (H) 4.0 - 10.5 K/uL   RBC 4.72 3.87 - 5.11 MIL/uL   Hemoglobin 12.8 12.0 - 15.0 g/dL   HCT 38.0 36.0 - 46.0 %   MCV 80.5 80.0 - 100.0 fL   MCH 27.1 26.0 - 34.0 pg   MCHC 33.7 30.0 - 36.0 g/dL   RDW 13.5 11.5 - 15.5 %   Platelets 332 150 - 400 K/uL   nRBC 0.0 0.0 - 0.2 %  Comprehensive metabolic panel   Collection Time: 04/13/21 12:26 AM  Result Value Ref Range   Sodium 135 135 - 145 mmol/L   Potassium 3.4 (L) 3.5 - 5.1  mmol/L   Chloride 107 98 - 111 mmol/L   CO2 19 (L) 22 - 32 mmol/L   Glucose, Bld 99 70 - 99 mg/dL   BUN 8 6 - 20 mg/dL   Creatinine, Ser 0.73 0.44 - 1.00 mg/dL   Calcium 9.2 8.9 - 10.3 mg/dL   Total Protein 6.1 (L) 6.5 - 8.1 g/dL   Albumin 2.6 (L) 3.5 - 5.0 g/dL   AST 20 15 - 41 U/L  ALT 17 0 - 44 U/L   Alkaline Phosphatase 121 38 - 126 U/L   Total Bilirubin 0.2 (L) 0.3 - 1.2 mg/dL   GFR, Estimated >60 >60 mL/min   Anion gap 9 5 - 15    Patient Active Problem List   Diagnosis Date Noted   Gestational hypertension 04/04/2021   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 G1P0000 at 75w3dhere for IOL due to gHTN.  #Labor: SVE closed/thick/high, cephalic presentation. Will initiate induction with buccal cytotec for cervical ripening. Will evaluate for foley balloon placement when able. Plan to reassess in 4 hours. Regarding birth plan, patient desires FOB to catch baby. #Pain: Labor support without medications, epidural as last resort #FWB: Cat 1 #ID: GBS negative #MOF: Breast #MOC: Undecided, desires non-hormonal options  #gHTN: Mild range BP on admission. Asymptomatic. CBC and CMP within normal limits. P/C ratio pending, last normal on 04/09/21. Will continue to monitor closely.  #Hypothyroidism: Stable. Will continue home Levothyroxine 112 mcg daily.  #Bipolar II: Well controlled on current medication. Will continue home Zoloft 50 mg daily.   SKarsten Fells DO PGY-1 04/13/2021, 1:30 AM   GME ATTESTATION:  I saw and evaluated the patient. I agree with the findings and the plan of care as documented in the  residents note. I have made changes to documentation as necessary.  IOL for gHTN. BP mild range on admit. P:C ratio, CBC, CMP within normal limits. No symptoms. Will continue to monitor. Continue home Synthroid and Zoloft. GBS negative. Buccal Cytotec to initiate induction. Counseled regarding foley balloon placement when able. Will reassess in 4 hours. Cat 1 tracing.   CVilma Meckel MD OB Fellow, FNew [place]for [place]Te[place]3/08/2021 3:42 AM

## 2021-04-13 NOTE — Progress Notes (Signed)
Labor Progress Note [name] is a 41 y.o. G1P0000 at 68w3dpresented for IOL for gHTN ? ?S:  ?Resting in bed comfortably currently ? ?O:  ?BP (!) 145/94   Pulse 90   Temp 98.3 ?F (36.8 ?C) (Oral)   Resp 18   Ht 5' 7.5" (1.715 m)   Wt 111.6 kg   LMP 07/25/2020   BMI 37.96 kg/m?  ?EFM: baseline 140s-150s bpm/ mod variability/ + accels/ no decels  ?Toco/IUPC: Occasional ?SVE: Dilation: Closed ?Effacement (%): Thick ?Cervical Position: Posterior ?Station: Ballotable ?Presentation: Vertex (BY ultrasound) ?Exam by:: ATowanda Malkin RN ? ?A/P: 41y.o. G1P0000 [redacted]w[redacted]d?1. Labor: latent, s/p cytotec x 3; will give additional cytotec dose now. Plan to place FB when able ?2. FWB: Cat I ?3. Pain: analgesia/anesthesia/NO prn ?4. gHTN: stable, asymptomatic, will continue to monitor serial BP ? ? ?MaGerrit HeckMD ?1:55 PM ? ? ?

## 2021-04-13 NOTE — Progress Notes (Signed)
Labor Progress Note ?CHARLINA DWIGHT is a 41 y.o. G1P0000 at 90w3dpresented for IOL for gHTN ? ?S:  ?Denies HA, visual disturbances, RUQ pain, SOB, and CP. Feeling some cramping, using heat packs.  ? ? ?O:  ?BP 131/77   Pulse 74   Temp 98.3 ?F (36.8 ?C) (Oral)   Resp 18   Ht 5' 7.5" (1.715 m)   Wt 111.6 kg   LMP 07/25/2020   BMI 37.96 kg/m?  ?EFM: baseline 130 bpm/ mod variability/ + accels/ no decels  ?Toco/IUPC: irreg ?SVE: Dilation: Closed ?Effacement (%): Thick ?Cervical Position: Posterior ?Station: Ballotable ?Presentation: Vertex (BY ultrasound) ?Exam by:: MColman Cater? ? ?A/P: 41y.o. G1P0000 327w3d?1. Labor: latent ?2. FWB: Cat I ?3. Pain: analgesia/anesthesia/NO prn ?4. gHTN: stable ? ?Continue Cytotec. Place FB when able. Anticipate labor progress and SVD. ? ?MeJulianne HandlerCNM ?9:26 AM ? ? ?

## 2021-04-14 LAB — PROTEIN / CREATININE RATIO, URINE
Creatinine, Urine: 138.98 mg/dL
Protein Creatinine Ratio: 0.12 mg/mg{Cre} (ref 0.00–0.15)
Total Protein, Urine: 16 mg/dL

## 2021-04-14 LAB — COMPREHENSIVE METABOLIC PANEL
ALT: 15 U/L (ref 0–44)
AST: 20 U/L (ref 15–41)
Albumin: 2.3 g/dL — ABNORMAL LOW (ref 3.5–5.0)
Alkaline Phosphatase: 110 U/L (ref 38–126)
Anion gap: 9 (ref 5–15)
BUN: 8 mg/dL (ref 6–20)
CO2: 20 mmol/L — ABNORMAL LOW (ref 22–32)
Calcium: 8.7 mg/dL — ABNORMAL LOW (ref 8.9–10.3)
Chloride: 107 mmol/L (ref 98–111)
Creatinine, Ser: 0.72 mg/dL (ref 0.44–1.00)
GFR, Estimated: >60 mL/min (ref >60)
Glucose, Bld: 104 mg/dL — ABNORMAL HIGH (ref 70–99)
Potassium: 3.4 mmol/L — ABNORMAL LOW (ref 3.5–5.1)
Sodium: 136 mmol/L (ref 135–145)
Total Bilirubin: 0.4 mg/dL (ref 0.3–1.2)
Total Protein: 5.5 g/dL — ABNORMAL LOW (ref 6.5–8.1)

## 2021-04-14 LAB — CBC
HCT: 38.6 % (ref 36.0–46.0)
Hemoglobin: 13 g/dL (ref 12.0–15.0)
MCH: 27.3 pg (ref 26.0–34.0)
MCHC: 33.7 g/dL (ref 30.0–36.0)
MCV: 80.9 fL (ref 80.0–100.0)
Platelets: 295 10*3/uL (ref 150–400)
RBC: 4.77 MIL/uL (ref 3.87–5.11)
RDW: 13.6 % (ref 11.5–15.5)
WBC: 13 10*3/uL — ABNORMAL HIGH (ref 4.0–10.5)
nRBC: 0 % (ref 0.0–0.2)

## 2021-04-14 MED ORDER — TERBUTALINE SULFATE 1 MG/ML IJ SOLN: 0.2500 mg | Freq: Once | INTRAMUSCULAR | Status: DC | PRN

## 2021-04-14 MED ORDER — LABETALOL HCL 5 MG/ML IV SOLN: 20.0000 mg | INTRAVENOUS | Status: DC | PRN

## 2021-04-14 MED ORDER — LABETALOL HCL 5 MG/ML IV SOLN: 40.0000 mg | INTRAVENOUS | Status: DC | PRN

## 2021-04-14 MED ORDER — OXYTOCIN-SODIUM CHLORIDE 30-0.9 UT/500ML-% IV SOLN
1.0000 m[IU]/min | INTRAVENOUS | Status: DC
  Administered 2021-04-14 – 2021-04-15 (×2): 2 m[IU]/min via INTRAVENOUS

## 2021-04-14 MED ORDER — LABETALOL HCL 5 MG/ML IV SOLN: 80.0000 mg | INTRAVENOUS | Status: DC | PRN

## 2021-04-14 MED ORDER — PROCHLORPERAZINE EDISYLATE 10 MG/2ML IJ SOLN
10.0000 mg | INTRAMUSCULAR | Status: DC | PRN
  Administered 2021-04-14: 10 mg via INTRAVENOUS
  Filled 2021-04-14 (×2): qty 2

## 2021-04-14 MED ORDER — HYDRALAZINE HCL 20 MG/ML IJ SOLN: 10.0000 mg | INTRAMUSCULAR | Status: DC | PRN

## 2021-04-14 MED ORDER — BUTORPHANOL TARTRATE 2 MG/ML IJ SOLN
2.0000 mg | INTRAMUSCULAR | Status: DC | PRN
  Administered 2021-04-14: 2 mg via INTRAVENOUS
  Filled 2021-04-14: qty 2

## 2021-04-14 MED ORDER — LABETALOL HCL 5 MG/ML IV SOLN
INTRAVENOUS | Status: AC
  Administered 2021-04-14: 20 mg via INTRAVENOUS
  Filled 2021-04-14: qty 4

## 2021-04-14 MED ORDER — MISOPROSTOL 50MCG HALF TABLET
50.0000 ug | ORAL_TABLET | ORAL | Status: DC | PRN
  Administered 2021-04-14 – 2021-04-15 (×2): 50 ug via BUCCAL
  Filled 2021-04-14 (×2): qty 1

## 2021-04-14 NOTE — Progress Notes (Signed)
Melissa Whitney is a 41 y.o. G1P0000 at 28w4dby LMP [facility] of [facility] ? ?Subjective: ?Pt comfortable, taking brief shower for comfort then will return to continuous monitoring. Husband in room for support.  ? ?Objective: ?BP 113/77   Pulse 63   Temp 98.1 ?F (36.7 ?C) (Oral)   Resp 18   Ht 5' 7.5" (1.715 m)   Wt 111.6 kg   LMP 07/25/2020   BMI 37.96 kg/m?  ?No intake/output data recorded. ?No intake/output data recorded. ? ?FHT:  FHR: 135 bpm, variability: moderate,  accelerations:  Present,  decelerations:  Absent ?UC:   irregular, every 2-10 minutes ?SVE:   Dilation: Fingertip ?Effacement (%): 80 ?Station: -3 ?Exam by:: NLoma Boston CNM Student ? ?Labs: ?Lab Results  ?Component Value Date  ? WBC 13.0 (H) 04/14/2021  ? HGB 13.0 04/14/2021  ? HCT 38.6 04/14/2021  ? MCV 80.9 04/14/2021  ? PLT 295 04/14/2021  ? ? ?Assessment / Plan: ?Protracted latent phase ? ?Labor:  Discussed options with pt including continuing Cytotec, changing to low dose Pitocin for cervical ripening and/or attempting foley bulb insertion again. Pt desires to switch to Pitocin for cervical ripening but would like to try this for a few hours and see if cervix becomes more favorable before foley bulb attempt.  Category I FHR tracing, BP normotensive currently   ?Preeclampsia:  labs stable ?Fetal Wellbeing:  Category I ?Pain Control:  Labor support without medications ?I/D:   GBS neg ?Anticipated MOD:  NSVD ? ?LLattie HawLeftwich-Kirby ?04/14/2021, 5:45 PM ? ? ?

## 2021-04-14 NOTE — Progress Notes (Addendum)
Melissa Whitney is a 41 y.o. G1P0000 at 62w4dby LMP [facility] of [facility] ? ?Subjective: ?Pt feeling contractions, coping well with breathing/relaxation, support person at bedside.  ? ?Objective: ?BP (!) 144/93   Pulse 75   Temp 97.6 ?F (36.4 ?C) (Oral)   Resp 18   Ht 5' 7.5" (1.715 m)   Wt 111.6 kg   LMP 07/25/2020   BMI 37.96 kg/m?  ?No intake/output data recorded. ?No intake/output data recorded. ? ?FHT:  FHR: 135 bpm, variability: moderate,  accelerations:  Present,  decelerations:  Absent ?UC:   irregular, every 2-10 minutes ?SVE:   Dilation: Closed ?Effacement (%): 80 ?Station: -3 ?Exam by::Fouad Taul Leftwich-Kirby, CNM ? ?Labs: ?Lab Results  ?Component Value Date  ? WBC 13.0 (H) 04/14/2021  ? HGB 13.0 04/14/2021  ? HCT 38.6 04/14/2021  ? MCV 80.9 04/14/2021  ? PLT 295 04/14/2021  ? ? ?Assessment / Plan: ?Induction of labor due to gestational hypertension ?S/p 9 doses of Cytotec, given both vaginally and as buccal doses ?Severe range BP x 2 this morning around 7 am, none since, no other s/sx of PEC ? ?Labor:  Pt meets criteria for PEC with severe features with 2 severe range BPs. I discussed recommendation to start IV Magnesium.  Pt does not want magnesium at this time and last BP was wnl.  Discussed risks of PEC to mom and baby, including eclampsia and seizure and pt states understanding.  Pt declines magnesium at this time but will consider if BP are severe range again.  Discussed augmentation/induction options with pt. Cervix closed but thin and soft. Continue to give current dose of Cytotec and wait 4 hours to check again or attempt foley balloon now.  Plan to give IV pain medication prior to procedure and attempt Foley bulb insertion.   ?Preeclampsia:  labs stable ?Fetal Wellbeing:  Category I ?Pain Control:  Labor support without medications ?I/D:   GBS neg ?Anticipated MOD:  NSVD ? ?LLattie HawLeftwich-Kirby ?04/14/2021, 12:40 PM ? ? ?

## 2021-04-14 NOTE — Progress Notes (Signed)
Labor Progress Note ?Melissa Whitney is a 41 y.o. G1P0000 at 98w4dpresented for IOL due to gHTN. ? ?S: Feeling more frequent contractions with the vaginal cytotec. ? ?O:  ?BP (!) 156/95   Pulse 60   Temp 97.6 ?F (36.4 ?C) (Oral)   Resp 16   Ht 5' 7.5" (1.715 m)   Wt 111.6 kg   LMP 07/25/2020   BMI 37.96 kg/m?  ?EFM: Baseline FHR 140 bpm/moderate variability/+accels, no decels ? ?CVE: Dilation: Closed ?Effacement (%): Thick ?Cervical Position: Posterior ?Station: -3 ?Presentation: Vertex ?Exam by:: CCletus Gash RN ? ? ?A&P: 41y.o. G1P0000 377w4d?#Labor: SVE unchanged. S/p cytotec x6. Will continue with additional vaginal cytotec. Plan to attempt foley balloon placement when able. Will reassess in 4 hours. ?#Pain: Labor support without medications; PRN if necessary ?#FWB: Cat 1 ?#GBS negative ? ?#gHTN: Mild range BP, stable. Asymptomatic. Continue to monitor.  ? ?SaKarsten FellsDO PGY-1 ?04/14/2021, 2:36 AM ? ? ?

## 2021-04-14 NOTE — Progress Notes (Signed)
Melissa Whitney is a 41 y.o. G1P0000 at 38w4dby LMP [facility] of [facility]  ? ?Subjective: ?[name] coping with CTXs using breathing techniques with FOB at bs for support. She does not report HA, abnormal VF, abd pain. Does not report decreased FM, vag bleeding, LOF. She reports some mild increasing discomfort with CTXs. ? ?Objective: ?BP 113/77   Pulse 63   Temp 98.1 ?F (36.7 ?C) (Oral)   Resp 18   Ht 5' 7.5" (1.715 m)   Wt 111.6 kg   LMP 07/25/2020   BMI 37.96 kg/m?  ?No intake/output data recorded. ?No intake/output data recorded. ? ?FHT:  FHR: 135 bpm, variability: moderate,  accelerations:  Present,  decelerations:  Absent ?UC:   irregular ?SVE:   Dilation: Fingertip ?Effacement (%): 80 ?Station: -3 ?Exam by:: NLoma Boston CNM Student ? ?Labs: ?Lab Results  ?Component Value Date  ? WBC 13.0 (H) 04/14/2021  ? HGB 13.0 04/14/2021  ? HCT 38.6 04/14/2021  ? MCV 80.9 04/14/2021  ? PLT 295 04/14/2021  ? ? ?Assessment / Plan: ?mIOL for gHTN at 366w4dn latent labor ? ?Discussed continued cytotec v. Low-dose pitocin for induction mechanism. Pt verbalized understanding and agrees to pitocin after shower ?Plan for bs USK[place]ao confirm vertex presentation prior to pitocin initiation ?SVE recheck in 4 hours PRN ? ?Labor:  routine ?Preeclampsia:  labs stable ?Fetal Wellbeing:  Category I ?Pain Control:  Labor support without medications and IV pain meds ?I/D:  n/a ?Anticipated MOD:  NSVD ? ?OnShella MaximSNM ?04/14/2021, 5:22 PM ? ? ?

## 2021-04-14 NOTE — Progress Notes (Signed)
Melissa Whitney is a 41 y.o. G1P0000 at 25w4dby LMP admitted for induction of labor due to gestational HTN. ? ?Subjective: ?Pt comfortable with IV Fentanyl, s/o at bedside for support. ? ?Objective: ?BP (!) 144/93   Pulse 75   Temp 97.6 ?F (36.4 ?C) (Oral)   Resp 18   Ht 5' 7.5" (1.715 m)   Wt 111.6 kg   LMP 07/25/2020   BMI 37.96 kg/m?  ?No intake/output data recorded. ?No intake/output data recorded. ? ?FHT:  FHR: 135 bpm, variability: moderate,  accelerations:  Present,  decelerations:  Absent ?UC:   irregular, every 2-10 minutes ?SVE:   Dilation: Closed ?Effacement (%): 80 ?Station: -3 ?Exam by::Leftwich-Kirby, CNM ? ?Foley bulb insertion attempted with SSE using both Cook catheter and traditional foley bulb using ring forceps, without success. Catheter will not insert through internal os, even with stylet/ring forceps.   ? ?Labs: ?Lab Results  ?Component Value Date  ? WBC 13.0 (H) 04/14/2021  ? HGB 13.0 04/14/2021  ? HCT 38.6 04/14/2021  ? MCV 80.9 04/14/2021  ? PLT 295 04/14/2021  ? ? ?Assessment / Plan: ?[facility]S/P Cyt[facility] x 9 ?Failed attempt at foley bulb insertion ? ?Labor:  Discussed options with pt. Plan to give buccal Cytotec now and recheck in 4 hours and attempt foley insertion again. Category I FHR tracing, BP stable, no additional severe range BPs. ?Preeclampsia:  labs stable ?Fetal Wellbeing:  Category I ?Pain Control:  IV pain meds ?I/D:   GBS neg ?Anticipated MOD:  NSVD ? ?LLattie HawLeftwich-Kirby ?04/14/2021, 12:45 PM ? ? ?

## 2021-04-14 NOTE — Progress Notes (Signed)
SHIKITA VAILLANCOURT is a 41 y.o. G1P0000 at 88w4dby LMP [facility] ? ?Subjective: ?Patient is comfortable, received analgesia in preparation for cervical foley placement. ? ?Objective: ?BP (!) 149/78   Pulse 88   Temp 98 ?F (36.7 ?C) (Oral)   Resp 16   Ht 5' 7.5" (1.715 m)   Wt 111.6 kg   LMP 07/25/2020   BMI 37.96 kg/m?  ?No intake/output data recorded. ?No intake/output data recorded. ? ?FHT:  FHR: 135 bpm, variability: moderate,  accelerations:  Present,  decelerations:  Absent ?UC:   irregular, every 2-10 minutes ?SVE:   FT/80/-3 ?Foley bulb placed with aid of speculum and filled with 60 cc of NS ? ?Labs: ?Lab Results  ?Component Value Date  ? WBC 13.0 (H) 04/14/2021  ? HGB 13.0 04/14/2021  ? HCT 38.6 04/14/2021  ? MCV 80.9 04/14/2021  ? PLT 295 04/14/2021  ? ? ?Assessment / Plan: ?Protracted latent phase ? ?Labor: s/p foley bulb placement, continue low dose pitocin for ripening ?Preeclampsia:   BP and labs stable ?Fetal Wellbeing:  Category I ?Pain Control:  Labor support without medications ?I/D:   GBS neg ?Anticipated MOD:  NSVD ? ?UVerita Schneiders MD ?04/14/2021, 11:38 PM ? ? ?

## 2021-04-14 NOTE — Progress Notes (Signed)
Labor Progress Note ?Melissa Whitney is a 41 y.o. G1P0000 at 2w4dpresented for IOL due to gHTN. ? ?S: Feels slightly discouraged with slow progression. Reports feeling more frequent contractions.  ? ?O:  ?BP 137/79   Pulse 68   Temp 97.6 ?F (36.4 ?C) (Oral)   Resp 16   Ht 5' 7.5" (1.715 m)   Wt 111.6 kg   LMP 07/25/2020   BMI 37.96 kg/m?  ?EFM: Baseline FHR 125 bpm/moderate variability/+accels, no decels ? ?CVE: Dilation: Fingertip ?Effacement (%): Thick ?Cervical Position: Posterior ?Station: -3 ?Presentation: Vertex ?Exam by:: HTona Sensing MD ? ? ?A&P: 41y.o. G1P0000 341w4d?#Labor: Notable softening of cervix, dilation and effacement remains similar. S/p cytotec x7. Continue vaginal cytotec. Plan to attempt foley balloon placement when able. Encouraged ambulation/exercise ball to assist with fetal descent. Will reassess in 4 hours. ?#Pain: Not tolerating SVE well, requests nitrous oxide during checks; planning for epidural  ?#FWB: Cat 1 ?#GBS negative ? ?#gHTN: Stable, mild range BP's. Asymptomatic. Continue to monitor.  ? ?SaKarsten FellsDO PGY-1 ?04/14/2021, 7:01 AM ? ? ?

## 2021-04-15 ENCOUNTER — Inpatient Hospital Stay (HOSPITAL_COMMUNITY): Payer: Medicaid Other | Admitting: Anesthesiology

## 2021-04-15 ENCOUNTER — Encounter (HOSPITAL_COMMUNITY): Payer: Self-pay | Admitting: Family Medicine

## 2021-04-15 LAB — CBC
HCT: 42.5 % (ref 36.0–46.0)
Hemoglobin: 14.4 g/dL (ref 12.0–15.0)
MCH: 27.6 pg (ref 26.0–34.0)
MCHC: 33.9 g/dL (ref 30.0–36.0)
MCV: 81.6 fL (ref 80.0–100.0)
Platelets: 324 10*3/uL (ref 150–400)
RBC: 5.21 MIL/uL — ABNORMAL HIGH (ref 3.87–5.11)
RDW: 14 % (ref 11.5–15.5)
WBC: 15.1 10*3/uL — ABNORMAL HIGH (ref 4.0–10.5)
nRBC: 0 % (ref 0.0–0.2)

## 2021-04-15 MED ORDER — LACTATED RINGERS IV SOLN: 500.0000 mL | Freq: Once | INTRAVENOUS | Status: DC

## 2021-04-15 MED ORDER — PHENYLEPHRINE 40 MCG/ML (10ML) SYRINGE FOR IV PUSH (FOR BLOOD PRESSURE SUPPORT)
80.0000 ug | PREFILLED_SYRINGE | INTRAVENOUS | Status: DC | PRN
  Filled 2021-04-15: qty 10

## 2021-04-15 MED ORDER — FENTANYL-BUPIVACAINE-NACL 0.5-0.125-0.9 MG/250ML-% EP SOLN
12.0000 mL/h | EPIDURAL | Status: DC | PRN
  Administered 2021-04-15: 18:00:00 12 mL/h via EPIDURAL
  Filled 2021-04-15: qty 250

## 2021-04-15 MED ORDER — DIPHENHYDRAMINE HCL 50 MG/ML IJ SOLN: 12.5000 mg | INTRAMUSCULAR | Status: DC | PRN

## 2021-04-15 MED ORDER — EPHEDRINE 5 MG/ML INJ: 10.0000 mg | INTRAVENOUS | Status: DC | PRN

## 2021-04-15 MED ORDER — PHENYLEPHRINE 40 MCG/ML (10ML) SYRINGE FOR IV PUSH (FOR BLOOD PRESSURE SUPPORT): 80.0000 ug | PREFILLED_SYRINGE | INTRAVENOUS | Status: DC | PRN

## 2021-04-15 MED ORDER — LIDOCAINE HCL (PF) 1 % IJ SOLN
INTRAMUSCULAR | Status: DC | PRN
  Administered 2021-04-15: 10 mL via EPIDURAL

## 2021-04-15 NOTE — Progress Notes (Signed)
Patient Vitals for the past 4 hrs: ? BP Temp Temp src Pulse Resp  ?04/15/21 2200 124/70 -- -- 67 --  ?04/15/21 2130 130/78 -- -- 79 16  ?04/15/21 2100 (!) 132/94 -- -- 75 --  ?04/15/21 2030 (!) 146/93 98 ?F (36.7 ?C) Oral 73 --  ?04/15/21 2000 123/89 -- -- 80 --  ?04/15/21 1930 (!) 143/86 -- -- 78 16  ?04/15/21 1901 (!) 140/95 -- -- 76 17  ? ?Comfortable w/epidural.  FHR Cat 1. Unable to trace ctx.  Pit @ 12 mu/min. Cx 4/100/-2.  AROM w/clear fluid and IUPC placed by Dr. Tona Sensing.  MVUs ~ 151mHg.  Continue present management.  ?

## 2021-04-15 NOTE — Progress Notes (Signed)
Patient Vitals for the past 4 hrs: ? BP Temp Temp src Pulse Resp  ?04/15/21 1311 123/73 -- -- 83 16  ?04/15/21 1112 128/72 98.2 ?F (36.8 ?C) Oral 78 17  ? ?FHR Cat 1. UI on EFM.  Foley still in. Pt very tired, unable to sleep d/t irregular ctx. Will be happy to take another cytotec.  "It sucks either way.". Offered IV narcotics w/next cytotec so she can sleep. Pt agrees.  ?

## 2021-04-15 NOTE — Progress Notes (Signed)
2174 was the actual time  ?

## 2021-04-15 NOTE — Progress Notes (Signed)
Melissa Whitney is a 41 y.o. G1P0000 at 29w5dby LMP admitted for induction of labor due to Hypertension. ? ?Subjective: ?Pt is a little frustrated with the long process. Discussed that she is able to take a hot shower with the bulb in and take the ball with her to reset.  ? ?Objective: ?BP (!) 136/100   Pulse 75   Temp 97.8 ?F (36.6 ?C) (Oral)   Resp 17   Ht 5' 7.5" (1.715 m)   Wt 111.6 kg   LMP 07/25/2020   BMI 37.96 kg/m?  ?No intake/output data recorded. ?No intake/output data recorded. ? ?FHT:  FHR: 125 bpm, variability: moderate,  accelerations:  Present,  decelerations:  Absent ?UC:   uterine irritability ?SVE:   Dilation: Fingertip ?Effacement (%):  (checked around foley bulb) ?Station: -3 ?Exam by:: CNM ? ?Labs: ?Lab Results  ?Component Value Date  ? WBC 13.0 (H) 04/14/2021  ? HGB 13.0 04/14/2021  ? HCT 38.6 04/14/2021  ? MCV 80.9 04/14/2021  ? PLT 295 04/14/2021  ? ? ?Assessment / Plan: ?IOL for gHTN, ripening phase. Giving one more oral Cytotec. ?Pt reassured about normalcy of this part of induction.  ? ?Labor:  Ripening Phase ?Gestational HTN: No severe range pressures ?Fetal Wellbeing:  Category I ?Pain Control:  Labor support without medications ?I/D:  n/a ?Anticipated MOD:  NSVD ? ?ATitus Dubin?04/15/2021, 9:10 AM ? ?Patient ID: Melissa Whitney female   DOB: 101/08/1980 41y.o.   MRN: [id number]? ?

## 2021-04-15 NOTE — Anesthesia Preprocedure Evaluation (Signed)
Anesthesia Evaluation  ?Patient identified by MRN, date of birth, ID band ?Patient awake ? ? ? ?Reviewed: ?Allergy & Precautions, H&P , NPO status , Patient's Chart, lab work & pertinent test results ? ?History of Anesthesia Complications ?Negative for: history of anesthetic complications ? ?Airway ?Mallampati: II ? ?TM Distance: >3 FB ? ? ? ? Dental ?  ?Pulmonary ?neg pulmonary ROS, former smoker,  ?  ?Pulmonary exam normal ? ? ? ? ? ? ? Cardiovascular ?hypertension, negative cardio ROS ? ? ?Rhythm:regular Rate:Normal ? ? ?  ?Neuro/Psych ?negative neurological ROS ? negative psych ROS  ? GI/Hepatic ?negative GI ROS, Neg liver ROS,   ?Endo/Other  ?Hypothyroidism  ? Renal/GU ?negative Renal ROS  ?negative genitourinary ?  ?Musculoskeletal ? ? Abdominal ?  ?Peds ? Hematology ?negative hematology ROS ?(+)   ?Anesthesia Other Findings ? ? Reproductive/Obstetrics ?(+) Pregnancy ? ?  ? ? ? ? ? ? ? ? ? ? ? ? ? ?  ?  ? ? ? ? ? ? ? ?Anesthesia Physical ?Anesthesia Plan ? ?ASA: 2 ? ?Anesthesia Plan: Epidural  ? ?Post-op Pain Management:   ? ?Induction:  ? ?PONV Risk Score and Plan:  ? ?Airway Management Planned:  ? ?Additional Equipment:  ? ?Intra-op Plan:  ? ?Post-operative Plan:  ? ?Informed Consent: I have reviewed the patients History and Physical, chart, labs and discussed the procedure including the risks, benefits and alternatives for the proposed anesthesia with the patient or authorized representative who has indicated his/her understanding and acceptance.  ? ? ? ? ? ?Plan Discussed with:  ? ?Anesthesia Plan Comments:   ? ? ? ? ? ? ?Anesthesia Quick Evaluation ? ?

## 2021-04-15 NOTE — Progress Notes (Signed)
Patient Vitals for the past 4 hrs: ? BP Pulse Resp  ?04/15/21 1538 (!) 141/94 82 16  ?04/15/21 1311 123/73 83 16  ? ?Pt never did take IV meds, husband just got here a little while ago and he brought food. Pt is very tired of cramping all the time, balloon still in.  Wants to get epidural and start pitocin.  We discussed the downside to this (being immobile and on clear liquids, possibly for a few more days--although - hopefully - unlikely) and fully accepts this.  Will proceed with that plan.  ?

## 2021-04-15 NOTE — Anesthesia Procedure Notes (Signed)
Epidural ?Patient location during procedure: OB ?Start time: 04/15/2021 5:40 PM ?End time: 04/15/2021 5:52 PM ? ?Staffing ?Anesthesiologist: Lidia Collum, MD ?Performed: anesthesiologist  ? ?Preanesthetic Checklist ?Completed: patient identified, IV checked, risks and benefits discussed, monitors and equipment checked, pre-op evaluation and timeout performed ? ?Epidural ?Patient position: sitting ?Prep: DuraPrep ?Patient monitoring: heart rate, continuous pulse ox and blood pressure ?Approach: midline ?Location: L3-L4 ?Injection technique: LOR air ? ?Needle:  ?Needle type: Tuohy  ?Needle gauge: 17 G ?Needle length: 9 cm ?Needle insertion depth: 6 cm ?Catheter type: closed end flexible ?Catheter size: 19 Gauge ?Catheter at skin depth: 11 cm ?Test dose: negative ? ?Assessment ?Events: blood not aspirated, injection not painful, no injection resistance, no paresthesia and negative IV test ? ?Additional Notes ?Reason for block:procedure for pain ? ? ? ?

## 2021-04-15 NOTE — Progress Notes (Signed)
Patient ID: Melissa Whitney, female   DOB: May 01, 1980, 41 y.o.   MRN: [id number] ? ?Cervical foley still in place- is being tugged on regularly and is descending; low dose Pitocin was stopped overnight to give pt some flexibility with monitoring; was able to get some rest last night but is still feeling overall exhausted; she isn't hungry but may get up to the shower and sit for awhile; no further severe range BPs in the past 24hrs ? ?BPs overnight: 151/91, 142/71, 144/69  ?FHR 120s, +accels, no decels ?Ctx irreg ?Cx deferred ? ?IUP'@37'$ .5wks ?gHTN ?IOL process ? ?Plan for Pitocin/epidural when foley comes out; anticipate vag del ? ?Myrtis Ser CNM ?04/15/2021 ? ? ? ?

## 2021-04-15 NOTE — Progress Notes (Signed)
Patient Vitals for the past 4 hrs: ? BP Temp Temp src Pulse Resp SpO2  ?04/15/21 1930 (!) 143/86 -- -- 78 16 --  ?04/15/21 1901 (!) 140/95 -- -- 76 17 --  ?04/15/21 1832 (!) 146/87 -- -- 89 -- --  ?04/15/21 1805 (!) 147/78 -- -- (!) 102 16 99 %  ?04/15/21 1800 130/70 -- -- 99 16 --  ?04/15/21 1755 130/83 -- -- 98 16 --  ?04/15/21 1748 (!) 156/104 -- -- 100 18 --  ?04/15/21 1730 -- 98.2 ?F (36.8 ?C) Oral -- 16 --  ? ?Comfortable w/epidural. So happy now.  FHR Cat 1.  Mild and irregular ctx.  Pitocin at 2 mu/min.  Cx feels about 1.5cms around foley, very thin.  Will continue to titrate pitocin until labor adequate.  ?

## 2021-04-16 ENCOUNTER — Encounter (HOSPITAL_COMMUNITY): Payer: Self-pay | Admitting: Family Medicine

## 2021-04-16 DIAGNOSIS — O99284 Endocrine, nutritional and metabolic diseases complicating childbirth: Secondary | ICD-10-CM

## 2021-04-16 DIAGNOSIS — O134 Gestational [pregnancy-induced] hypertension without significant proteinuria, complicating childbirth: Secondary | ICD-10-CM

## 2021-04-16 DIAGNOSIS — Z3A37 37 weeks gestation of pregnancy: Secondary | ICD-10-CM

## 2021-04-16 LAB — CBC
HCT: 39.7 % (ref 36.0–46.0)
Hemoglobin: 13.3 g/dL (ref 12.0–15.0)
MCH: 27.6 pg (ref 26.0–34.0)
MCHC: 33.5 g/dL (ref 30.0–36.0)
MCV: 82.4 fL (ref 80.0–100.0)
Platelets: 301 10*3/uL (ref 150–400)
RBC: 4.82 MIL/uL (ref 3.87–5.11)
RDW: 14 % (ref 11.5–15.5)
WBC: 21.3 10*3/uL — ABNORMAL HIGH (ref 4.0–10.5)
nRBC: 0 % (ref 0.0–0.2)

## 2021-04-16 MED ORDER — NIFEDIPINE ER OSMOTIC RELEASE 30 MG PO TB24
30.0000 mg | ORAL_TABLET | Freq: Every day | ORAL | Status: DC
  Administered 2021-04-16: 30 mg via ORAL
  Filled 2021-04-16: qty 1

## 2021-04-16 MED ORDER — BENZOCAINE-MENTHOL 20-0.5 % EX AERO
1.0000 "application " | INHALATION_SPRAY | CUTANEOUS | Status: DC | PRN
  Administered 2021-04-16: 1 via TOPICAL
  Filled 2021-04-16: qty 56

## 2021-04-16 MED ORDER — FUROSEMIDE 20 MG PO TABS
20.0000 mg | ORAL_TABLET | Freq: Every day | ORAL | Status: DC
  Administered 2021-04-16 – 2021-04-17 (×2): 20 mg via ORAL
  Filled 2021-04-16 (×2): qty 1

## 2021-04-16 MED ORDER — MEASLES, MUMPS & RUBELLA VAC IJ SOLR: 0.5000 mL | Freq: Once | INTRAMUSCULAR | Status: DC

## 2021-04-16 MED ORDER — METHYLERGONOVINE MALEATE 0.2 MG PO TABS: 0.2000 mg | ORAL_TABLET | ORAL | Status: DC | PRN

## 2021-04-16 MED ORDER — IBUPROFEN 600 MG PO TABS
600.0000 mg | ORAL_TABLET | Freq: Four times a day (QID) | ORAL | Status: DC
  Administered 2021-04-16 – 2021-04-17 (×5): 600 mg via ORAL
  Filled 2021-04-16 (×6): qty 1

## 2021-04-16 MED ORDER — DIPHENHYDRAMINE HCL 25 MG PO CAPS: 25.0000 mg | ORAL_CAPSULE | Freq: Four times a day (QID) | ORAL | Status: DC | PRN

## 2021-04-16 MED ORDER — BISACODYL 10 MG RE SUPP
10.0000 mg | Freq: Every day | RECTAL | Status: DC | PRN
Start: 2021-04-16 — End: 2021-04-17

## 2021-04-16 MED ORDER — SERTRALINE HCL 50 MG PO TABS
50.0000 mg | ORAL_TABLET | Freq: Every day | ORAL | Status: DC
  Administered 2021-04-16 – 2021-04-17 (×2): 50 mg via ORAL
  Filled 2021-04-16 (×2): qty 1

## 2021-04-16 MED ORDER — MEDROXYPROGESTERONE ACETATE 150 MG/ML IM SUSP
150.0000 mg | INTRAMUSCULAR | Status: DC | PRN
  Filled 2021-04-16: qty 1

## 2021-04-16 MED ORDER — SIMETHICONE 80 MG PO CHEW: 80.0000 mg | CHEWABLE_TABLET | ORAL | Status: DC | PRN

## 2021-04-16 MED ORDER — ONDANSETRON HCL 4 MG PO TABS: 4.0000 mg | ORAL_TABLET | ORAL | Status: DC | PRN

## 2021-04-16 MED ORDER — ACETAMINOPHEN 325 MG PO TABS
650.0000 mg | ORAL_TABLET | ORAL | Status: DC | PRN
  Administered 2021-04-17: 650 mg via ORAL
  Filled 2021-04-16: qty 2

## 2021-04-16 MED ORDER — PRENATAL MULTIVITAMIN CH
1.0000 | ORAL_TABLET | Freq: Every day | ORAL | Status: DC
Start: 2021-04-16 — End: 2021-04-17
  Administered 2021-04-16 – 2021-04-17 (×2): 1 via ORAL
  Filled 2021-04-16 (×2): qty 1

## 2021-04-16 MED ORDER — ONDANSETRON HCL 4 MG/2ML IJ SOLN: 4.0000 mg | INTRAMUSCULAR | Status: DC | PRN

## 2021-04-16 MED ORDER — COCONUT OIL OIL: 1.0000 "application " | TOPICAL_OIL | Status: DC | PRN

## 2021-04-16 MED ORDER — METHYLERGONOVINE MALEATE 0.2 MG/ML IJ SOLN: 0.2000 mg | INTRAMUSCULAR | Status: DC | PRN

## 2021-04-16 MED ORDER — TETANUS-DIPHTH-ACELL PERTUSSIS 5-2.5-18.5 LF-MCG/0.5 IM SUSY: 0.5000 mL | PREFILLED_SYRINGE | Freq: Once | INTRAMUSCULAR | Status: DC

## 2021-04-16 MED ORDER — FERROUS SULFATE 325 (65 FE) MG PO TABS
325.0000 mg | ORAL_TABLET | ORAL | Status: DC
Start: 2021-04-16 — End: 2021-04-17
  Filled 2021-04-16: qty 1

## 2021-04-16 MED ORDER — DIBUCAINE (PERIANAL) 1 % EX OINT: 1.0000 "application " | TOPICAL_OINTMENT | CUTANEOUS | Status: DC | PRN

## 2021-04-16 MED ORDER — INFLUENZA VAC SPLIT QUAD 0.5 ML IM SUSY
0.5000 mL | PREFILLED_SYRINGE | INTRAMUSCULAR | Status: AC
  Administered 2021-04-17: 0.5 mL via INTRAMUSCULAR
  Filled 2021-04-16: qty 0.5

## 2021-04-16 MED ORDER — WITCH HAZEL-GLYCERIN EX PADS: 1.0000 "application " | MEDICATED_PAD | CUTANEOUS | Status: DC | PRN

## 2021-04-16 MED ORDER — NIFEDIPINE ER OSMOTIC RELEASE 30 MG PO TB24
60.0000 mg | ORAL_TABLET | Freq: Every day | ORAL | Status: DC
  Administered 2021-04-17: 60 mg via ORAL
  Filled 2021-04-16: qty 2

## 2021-04-16 MED ORDER — DOCUSATE SODIUM 100 MG PO CAPS
100.0000 mg | ORAL_CAPSULE | Freq: Two times a day (BID) | ORAL | Status: DC
  Administered 2021-04-16 – 2021-04-17 (×2): 100 mg via ORAL
  Filled 2021-04-16 (×2): qty 1

## 2021-04-16 MED ORDER — FLEET ENEMA 7-19 GM/118ML RE ENEM: 1.0000 | ENEMA | Freq: Every day | RECTAL | Status: DC | PRN

## 2021-04-16 MED ORDER — NIFEDIPINE ER OSMOTIC RELEASE 30 MG PO TB24
30.0000 mg | ORAL_TABLET | Freq: Once | ORAL | Status: AC
  Administered 2021-04-16: 30 mg via ORAL
  Filled 2021-04-16: qty 1

## 2021-04-16 MED ORDER — LEVOTHYROXINE SODIUM 112 MCG PO TABS
112.0000 ug | ORAL_TABLET | Freq: Every day | ORAL | Status: DC
  Administered 2021-04-16 – 2021-04-17 (×2): 112 ug via ORAL
  Filled 2021-04-16 (×2): qty 1

## 2021-04-16 NOTE — Lactation Note (Signed)
This note was copied from a baby's chart. ?Lactation Consultation Note ? ?Patient Name: Melissa Whitney ?Today's Date: 04/16/2021 ?  ?Age:41 hours ? ? ?LC Note: ? ?Attempted to visit with family, however, they have a "We are Resting" sign on their door.  RN will call when mother is ready for a consult. ? ? ?Maternal Data ?  ? ?Feeding ?  ? ?LATCH Score ?  ? ?  ? ?  ? ?  ? ?  ? ?  ? ? ?Lactation Tools Discussed/Used ?  ? ?Interventions ?  ? ?Discharge ?  ? ?Consult Status ?  ? ? ? ?Melissa Whitney ?04/16/2021, 8:30 AM ? ? ? ?

## 2021-04-16 NOTE — Discharge Summary (Signed)
? ?  Postpartum Discharge Summary ? ?   ?Patient Name: Melissa Whitney ?DOB: [birth year] ?MRN: [id number] ? ?Date of admission: 04/13/2021 ?Delivery date:04/16/2021  ?Delivering provider: Vennie Homans  ?Date of discharge: 04/17/2021 ? ?Admitting diagnosis: Gestational hypertension [O13.9] ?Intrauterine pregnancy: [redacted]w[redacted]d    ?Secondary diagnosis:  Principal Problem: ?  Gestational hypertension ?Active Problems: ?  Hypothyroid ?  AMA (advanced maternal age) primigravida 325+ third trimester ?  Encounter for supervision of pregnancy resulting from assisted reproductive technology in third trimester, antepartum ?  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  Bipolar II disorder (H[place]bright ? ?Additional problems: none    ?Discharge diagnosis: Term Pregnancy Delivered and Gestational Hypertension                                              ?Post partum procedures: none ?Augmentation: AROM, Pitocin, Cytotec, and IP Foley ?Complications: None ? ?Hospital course: Induction of Labor With Vaginal Delivery   ?41y.o. yo G1P0000 at 347w6das admitted to the hospital 04/13/2021 for induction of labor.  Indication for induction: Gestational hypertension, neg pre-e labs on admission and prior to d/c. Patient had an extensive labor course remarkable for receiving 9+ doses of cytotec prior to foley placement, but then with Pitocin and AROM she became active and progressed well. ?Membrane Rupture Time/Date: 10:20 PM ,04/15/2021   ?Delivery Method:Vaginal, Spontaneous  ?Episiotomy: None  ?Lacerations:  2nd degree;Periurethral;Perineal  ?Details of delivery can be found in separate delivery note.  Patient had a postpartum course remarkable for being started on Procardia and Lasix, with Lisinopril added on PPD#1 prior to d/c for additional control as she was still have mild/mod range elevations and a strong desire to be discharged. Patient is discharged home 04/17/21 as long as the baby can go as well. ? ?Newborn Data: ?Birth date:04/16/2021  ?Birth time:2:03 AM   ?Gender:Female  ?Living status:Living  ?Apgars:8 ,9  ?Weight:2520 g (5lb 8.9oz) ? ?Magnesium Sulfate received: No ?BMZ received: No ?Rhophylac:N/A ?MMR:N/A ?T-DaP:Given prenatally ?Flu: No ?Transfusion:No ? ?Physical exam  ?Vitals:  ? 04/16/21 1351 04/16/21 1657 04/16/21 1922 04/17/21 0551  ?BP: (!) 145/79 (!) 151/99 (!) 145/97 (!) 126/93  ?Pulse: 71 83 84 69  ?Resp: _0 ?Temp: 97.8 ?F (36.6 ?C) 97.9 ?F (36.6 ?C) 98.5 ?F (36.9 ?C) 98.1 ?F (36.7 ?C)  ?TempSrc: Oral Oral Oral Oral  ?SpO2:   99% 98%  ?Weight:      ?Height:      ? ?General: alert and cooperative ?Lochia: appropriate ?Uterine Fundus: firm ?Incision: N/A ?DVT Evaluation: No evidence of DVT seen on physical exam. ?Labs: ?Lab Results  ?Component Value Date  ? WBC 10.4 04/17/2021  ? HGB 11.4 (L) 04/17/2021  ? HCT 33.7 (L) 04/17/2021  ? MCV 81.4 04/17/2021  ? PLT 265 04/17/2021  ? ?CMP Latest Ref Rng & Units 04/17/2021  ?Glucose 70 - 99 mg/dL 103(H)  ?BUN 6 - 20 mg/dL 8  ?Creatinine 0.44 - 1.00 mg/dL 0.67  ?Sodium 135 - 145 mmol/L 136  ?Potassium 3.5 - 5.1 mmol/L 3.5  ?Chloride 98 - 111 mmol/L 103  ?CO2 22 - 32 mmol/L 24  ?Calcium 8.9 - 10.3 mg/dL 9.4  ?Total Protein 6.5 - 8.1 g/dL 5.3(L)  ?Total Bilirubin 0.3 - 1.2 mg/dL <0.1(L)  ?Alkaline Phos 38 - 126 U/L 101  ?AST 15 - 41 U/L 21  ?  ALT 0 - 44 U/L 14  ? ?Edinburgh Score: ?Edinburgh Postnatal Depression Scale Screening Tool 04/16/2021  ?I have been able to laugh and see the funny side of things. (No Data)  ? ? ? ?After visit meds:  ?Allergies as of 04/17/2021   ? ?   Reactions  ? Other   ? Poppy Seed  ? ?  ? ?  ?Medication List  ?  ? ?TAKE these medications   ? ?furosemide 20 MG tablet ?Commonly known as: LASIX ?Take 1 tablet (20 mg total) by mouth daily for 4 days. ?  ?ibuprofen 600 MG tablet ?Commonly known as: ADVIL ?Take 1 tablet (600 mg total) by mouth every 6 (six) hours as needed. ?  ?levothyroxine 112 MCG tablet ?Commonly known as: SYNTHROID ?Take 1 tablet (112 mcg total) by mouth daily before  breakfast. ?  ?lisinopril 20 MG tablet ?Commonly known as: ZESTRIL ?Take 1 tablet (20 mg total) by mouth daily. ?  ?NIFEdipine 60 MG 24 hr tablet ?Commonly known as: ADALAT CC ?Take 1 tablet (60 mg total) by mouth daily. ?  ?PRENATAL VITAMIN PO ?Take by mouth. ?  ?sertraline 50 MG tablet ?Commonly known as: ZOLOFT ?Take 1 tablet (50 mg total) by mouth daily. ?  ? ?  ? ? ? ?Discharge home in stable condition ?Infant Feeding: Breast ?Infant Disposition:home with mother ?Discharge instruction: per After Visit Summary and Postpartum booklet. ?Activity: Advance as tolerated. Pelvic rest for 6 weeks.  ?Diet: routine diet ?Future Appointments: ?Future Appointments  ?Date Time Provider [place]  ?04/23/2021 10:20 AM [facility] NURSE [facility]  ?05/20/2021  3:35 PM Ardean Larsen, Mervyn Skeeters, CNM [facility]  ? ?Follow up Visit: ? ? ?Please schedule this patient for a In person postpartum visit in 4 weeks with the following provider: Any provider. ?Additional Postpartum F/U:BP check 1 week  ?Low risk pregnancy complicated by: HTN ?Delivery mode:  Vaginal, Spontaneous  ?Anticipated Birth Control:  Unsure ? ? ?04/17/2021 ?Myrtis Ser, CNM ?8:33 AM ? ? ? ? ?

## 2021-04-16 NOTE — Lactation Note (Signed)
This note was copied from a baby's chart. ?Lactation Consultation Note ? ?Patient Name: Melissa Whitney ?Today's Date: 04/16/2021 ?Reason for consult: Initial assessment;Early term 37-38.6wks;1st time breastfeeding;Primapara ?Age:39 hours ? ? ?P1 mother whose infant is now 57 hours old.  This is an ETI at 37+6 weeks weighing < 6 lbs.  Mother's current feeding preference is breast. ? ?RN requested latch assistance. ? ?Baby "Barnett Applebaum" was STS with mother; RN in room and mentioned that her temperature is low.  Taught hand expression and finger fed colostrum drops to "Barnett Applebaum."  Assisted to latch easily in the football hold, however, she was not interested in initiating a suck; pushed back repeatedly off the breast.  Latched in the cross cradle hold with a few sucks but not much interest at this time.  Placed her back STS, covered with warm blankets and hat on.  Baby quiet. ? ?Reviewed feeding plan for today and encouraged to feed on cue or at least every three hours due to gestational age and size.  Mother will call for latch assistance as needed.  Father present and supportive.  Grandmother in to visit at the end of my consult. ? ? ?Maternal Data ?Has patient been taught Hand Expression?: Yes ?Does the patient have breastfeeding experience prior to this delivery?: No ? ?Feeding ?Mother's Current Feeding Choice: Breast Milk ? ?LATCH Score ?Latch: Repeated attempts needed to sustain latch, nipple held in mouth throughout feeding, stimulation needed to elicit sucking reflex. ? ?Audible Swallowing: None ? ?Type of Nipple: Everted at rest and after stimulation ? ?Comfort (Breast/Nipple): Soft / non-tender ? ?Hold (Positioning): Assistance needed to correctly position infant at breast and maintain latch. ? ?LATCH Score: 6 ? ? ?Lactation Tools Discussed/Used ?  ? ?Interventions ?Interventions: Breast feeding basics reviewed;Assisted with latch;Skin to skin;Breast massage;Hand express;Breast compression;Adjust  position;Position options;Support pillows;Education ? ?Discharge ?Pump: Personal ? ?Consult Status ?Consult Status: Follow-up ?Date: 04/17/21 ?Follow-up type: In-patient ? ? ? ?Ayumi Wangerin R Aubryn Spinola ?04/16/2021, 10:03 AM ? ? ? ?

## 2021-04-16 NOTE — Clinical Social Work Maternal (Signed)
?CLINICAL SOCIAL WORK MATERNAL/CHILD NOTE ? ?Patient Details  ?Name: Melissa Whitney ?MRN: [id number] ?Date of Birth: Sep 26, 1980 ? ?Date:  04/16/2021 ? ?Clinical Social Worker Initiating Note:  Kathrin Greathouse, LCSW Date/Time: Initiated:  04/16/21/1453    ? ?Child's Name:  Romilda [facility]  ? ?Biological Parents:  Mother, Father Lavina Resor 1982/06/08, Lilian Kapur 06-02-1974)  ? ?Need for Interpreter:  None  ? ?Reason for Referral:  Behavioral Health Concerns  ? ?Address:  [place] [address]  ?  ?Phone number:  [phone] (home)    ? ?Additional phone number:  ? ?Household Members/Support Persons (HM/SP):   Household Member/Support Person 1 ? ? ?HM/SP Name Relationship DOB or Age  ?HM/SP -1 Carmelle Bamberg Spouse 06-02-1974  ?HM/SP -2        ?HM/SP -3        ?HM/SP -4        ?HM/SP -5        ?HM/SP -6        ?HM/SP -7        ?HM/SP -8        ? ? ?Natural Supports (not living in the home):  Friends, Extended Family, Immediate Family, Community  ? ?Professional Supports: Transport planner, Other (Comment) Heritage manager)  ? ?Employment: Self-employed  ? ?Type of Work:    ? ?Education:  Graduate degree  ? ?Homebound arranged:   ? ?Financial Resources:  Medicaid  ? ?Other Resources:  WIC  ? ?Cultural/Religious Considerations Which May Impact Care:   ? ?Strengths:  Ability to meet basic needs  , Home prepared for child  , Psychotropic Medications, Pediatrician chosen  ? ?Psychotropic Medications:  Zoloft     ? ?Pediatrician:    [place] area ? ?Pediatrician List:  ? ?[place]  ?[facility]    ?[facility]    ?[facility]    ?[facility]    ?[facility]    ? ? ?Pediatrician Fax Number:   ? ?Risk Factors/Current Problems:  None  ? ?Cognitive State:  Able to Concentrate  , Insightful  , Linear Thinking    ? ?Mood/Affect:  Calm  , Comfortable  , Bright  , Interested    ? ?CSW Assessment: CSW received consult for Bipolar II.  CSW met with MOB to offer  support and complete assessment ? ?CSW met with MOB at bedside and introduced CSW role. CSW observed MOB resting awake in bed, infant asleep in bassinet and FOB present at bedside. MOB presented calm, happy, and welcomed CSW to complete the assessment with her spouse present. MOB confirmed that the demographic information on is correct. MOB reported she, her spouse and now "Elle" live together. MOB reported that she is self-employed and receives [facility] benefit which has been updated with the birth of the infant. CSW inquired about MOB supports. MOB identified her parents, spouse, siblings, "wide network or moms" as supports.  ? ?CSW inquired how MOB has felt since giving birth. MOB reported feeling "exhausted and really happy. ?MOB shared the labor and delivery went great. CSW asked MOB how she felt emotionally during the pregnancy. MOB reported she felt really great. CSW inquired about MOB mental health history. MOB reported that she was diagnosed with Bipolar II a year before getting pregnant. MOB reported she was on a mood stabilizer prior to pregnancy and now takes Zoloft which works well for her. MOB reported she has a mental health care team Psychiatrist Launa Flight and [place] that will continue  to provide care postpartum. CSW discussed PPD and provided MOB with resources. CSW provided education regarding the baby blues period vs. perinatal mood disorders.  CSW recommended MOB complete a self-evaluation during the postpartum time period using the New Mom Checklist from Postpartum Progress and encouraged MOB to contact a medical professional if symptoms are noted at any time. CSW assessed MOB for safety. MOB denied thoughts of harm to self and others.  ? ? ?MOB reported she has all essential items to care for the infant including bassinet where the infant will sleep. CSW provided review of Sudden Infant Death Syndrome (SIDS) precautions. MOB has chosen [facility] for the infant's  follow up care. CSW assessed MOB for additional needs. MOB reported no further need.  ? ?CSW identifies no further need for intervention and no barriers to discharge at this time. ? ?CSW Plan/Description:  Sudden Infant Death Syndrome (SIDS) Education, No Further Intervention Required/No Barriers to Discharge, Perinatal Mood and Anxiety Disorder (PMADs) Education  ? ? ?Lia Hopping, LCSW ?04/16/2021, 3:22 PM ? ?

## 2021-04-16 NOTE — Anesthesia Postprocedure Evaluation (Signed)
Anesthesia Post Note ? ?Patient: Melissa Whitney ? ?Procedure(s) Performed: AN AD HOC LABOR EPIDURAL ? ?  ? ?Patient location during evaluation: Mother Baby ?Anesthesia Type: Epidural ?Level of consciousness: awake and alert ?Pain management: pain level controlled ?Vital Signs Assessment: post-procedure vital signs reviewed and stable ?Respiratory status: spontaneous breathing, nonlabored ventilation and respiratory function stable ?Cardiovascular status: stable ?Postop Assessment: no headache, no backache, epidural receding, no apparent nausea or vomiting, patient able to bend at knees, adequate PO intake and able to ambulate ?Anesthetic complications: no ? ? ?No notable events documented. ? ?Last Vitals:  ?Vitals:  ? 04/16/21 0611 04/16/21 0905  ?BP: (!) 149/94 (!) 153/94  ?Pulse: 84 79  ?Resp: 18 18  ?Temp: 37.1 ?C 36.6 ?C  ?SpO2: 99%   ?  ?Last Pain:  ?Vitals:  ? 04/16/21 0910  ?TempSrc:   ?PainSc: 0-No pain  ? ?Pain Goal:   ? ?  ?  ?  ?  ?  ?  ?  ? ?[name] ? ? ? ? ?

## 2021-04-17 LAB — CBC WITH DIFFERENTIAL/PLATELET
Abs Immature Granulocytes: 0.06 10*3/uL (ref 0.00–0.07)
Basophils Absolute: 0.1 10*3/uL (ref 0.0–0.1)
Basophils Relative: 1 %
Eosinophils Absolute: 0.3 10*3/uL (ref 0.0–0.5)
Eosinophils Relative: 3 %
HCT: 33.7 % — ABNORMAL LOW (ref 36.0–46.0)
Hemoglobin: 11.4 g/dL — ABNORMAL LOW (ref 12.0–15.0)
Immature Granulocytes: 1 %
Lymphocytes Relative: 36 %
Lymphs Abs: 3.8 10*3/uL (ref 0.7–4.0)
MCH: 27.5 pg (ref 26.0–34.0)
MCHC: 33.8 g/dL (ref 30.0–36.0)
MCV: 81.4 fL (ref 80.0–100.0)
Monocytes Absolute: 0.7 10*3/uL (ref 0.1–1.0)
Monocytes Relative: 7 %
Neutro Abs: 5.5 10*3/uL (ref 1.7–7.7)
Neutrophils Relative %: 52 %
Platelets: 265 10*3/uL (ref 150–400)
RBC: 4.14 MIL/uL (ref 3.87–5.11)
RDW: 13.9 % (ref 11.5–15.5)
WBC: 10.4 10*3/uL (ref 4.0–10.5)
nRBC: 0 % (ref 0.0–0.2)

## 2021-04-17 LAB — COMPREHENSIVE METABOLIC PANEL
ALT: 14 U/L (ref 0–44)
AST: 21 U/L (ref 15–41)
Albumin: 2.1 g/dL — ABNORMAL LOW (ref 3.5–5.0)
Alkaline Phosphatase: 101 U/L (ref 38–126)
Anion gap: 9 (ref 5–15)
BUN: 8 mg/dL (ref 6–20)
CO2: 24 mmol/L (ref 22–32)
Calcium: 9.4 mg/dL (ref 8.9–10.3)
Chloride: 103 mmol/L (ref 98–111)
Creatinine, Ser: 0.67 mg/dL (ref 0.44–1.00)
GFR, Estimated: >60 mL/min (ref >60)
Glucose, Bld: 103 mg/dL — ABNORMAL HIGH (ref 70–99)
Potassium: 3.5 mmol/L (ref 3.5–5.1)
Sodium: 136 mmol/L (ref 135–145)
Total Bilirubin: <0.1 mg/dL — ABNORMAL LOW (ref 0.3–1.2)
Total Protein: 5.3 g/dL — ABNORMAL LOW (ref 6.5–8.1)

## 2021-04-17 MED ORDER — FUROSEMIDE 20 MG PO TABS: 20.0000 mg | ORAL_TABLET | Freq: Every day | ORAL | 0 refills | Status: DC

## 2021-04-17 MED ORDER — LISINOPRIL 20 MG PO TABS: 20.0000 mg | ORAL_TABLET | Freq: Every day | ORAL | 1 refills | Status: DC

## 2021-04-17 MED ORDER — LISINOPRIL 20 MG PO TABS
20.0000 mg | ORAL_TABLET | Freq: Every day | ORAL | Status: DC
  Administered 2021-04-17: 20 mg via ORAL
  Filled 2021-04-17: qty 1

## 2021-04-17 MED ORDER — NIFEDIPINE ER 60 MG PO TB24: 60.0000 mg | ORAL_TABLET | Freq: Every day | ORAL | 1 refills | Status: DC

## 2021-04-17 MED ORDER — IBUPROFEN 600 MG PO TABS: 600.0000 mg | ORAL_TABLET | Freq: Four times a day (QID) | ORAL | 0 refills | Status: DC | PRN

## 2021-04-19 ENCOUNTER — Encounter: Payer: Self-pay | Admitting: Certified Nurse Midwife

## 2021-04-20 ENCOUNTER — Other Ambulatory Visit: Payer: Self-pay

## 2021-04-20 MED ORDER — OXYCODONE HCL 5 MG PO TABS: 5.0000 mg | ORAL_TABLET | ORAL | 0 refills | Status: DC | PRN

## 2021-04-20 NOTE — Telephone Encounter (Signed)
Pt requests via MyChart that she is having severe pain after having a second degree tear on 04/16/21 from a vaginal delivery.  Pt states that she would like some more than just ibuprofen that was prescribed.  Message routed to [place], MD.   ? ?TRUE Shackleford,RN  ?04/20/21 ?

## 2021-04-23 ENCOUNTER — Other Ambulatory Visit: Payer: Self-pay

## 2021-04-23 ENCOUNTER — Ambulatory Visit (INDEPENDENT_AMBULATORY_CARE_PROVIDER_SITE_OTHER): Payer: Medicaid Other

## 2021-04-23 VITALS — BP 107/74 | HR 81 | Wt 227.6 lb

## 2021-04-23 DIAGNOSIS — Z0130 Encounter for examination of blood pressure without abnormal findings: Secondary | ICD-10-CM

## 2021-04-23 NOTE — Progress Notes (Signed)
Pt here today for BP check s/p vaginal delivery on 04/16/21.  Pt denies headache or visual changes. BP LA 107/74.  Pt reports that she is taking Lisinopril 20 mg po daily and Procardia 60 mg po daily.  Pt advised to continue to take the BP meds as prescribed and she will be evaluated at her pp visit on 05/20/21.  Pt encouraged to call the office or send a MyChart message with questions or concerns.  Pt verbalized understanding.   ? ?Pawan Knechtel,RN  ?04/23/21 ?

## 2021-04-26 ENCOUNTER — Encounter: Payer: Self-pay | Admitting: Certified Nurse Midwife

## 2021-04-27 ENCOUNTER — Other Ambulatory Visit: Payer: Self-pay | Admitting: *Deleted

## 2021-04-27 DIAGNOSIS — N898 Other specified noninflammatory disorders of vagina: Secondary | ICD-10-CM

## 2021-04-27 MED ORDER — FLUCONAZOLE 150 MG PO TABS: 150.0000 mg | ORAL_TABLET | Freq: Once | ORAL | 0 refills | Status: AC

## 2021-05-13 ENCOUNTER — Ambulatory Visit: Payer: Medicaid Other

## 2021-05-16 ENCOUNTER — Other Ambulatory Visit: Payer: Self-pay | Admitting: Advanced Practice Midwife

## 2021-05-20 ENCOUNTER — Ambulatory Visit (INDEPENDENT_AMBULATORY_CARE_PROVIDER_SITE_OTHER): Payer: Medicaid Other | Admitting: Student

## 2021-05-20 ENCOUNTER — Other Ambulatory Visit (HOSPITAL_COMMUNITY)
Admission: RE | Admit: 2021-05-20 | Discharge: 2021-05-20 | Disposition: A | Discharge status: Home / Self Care | Payer: Medicaid Other | Source: Ambulatory Visit | Attending: Student | Admitting: Student

## 2021-05-20 ENCOUNTER — Encounter: Payer: Self-pay | Admitting: Student

## 2021-05-20 VITALS — BP 109/74 | HR 62 | Wt 231.0 lb

## 2021-05-20 DIAGNOSIS — O0990 Supervision of high risk pregnancy, unspecified, unspecified trimester: Secondary | ICD-10-CM | POA: Diagnosis present

## 2021-05-20 NOTE — Progress Notes (Signed)
? ? ?Post Partum Visit Note ? ?Melissa Whitney is a 41 y.o. G71P1001 female who presents for a postpartum visit. She is  4.6  weeks postpartum following a normal spontaneous vaginal delivery.  I have fully reviewed the prenatal and intrapartum course. The delivery was at 37.6 gestational weeks.  Anesthesia: epidural. Postpartum course has been uncomplicated. Baby is doing well. Baby is feeding by breast. Bleeding no bleeding. Bowel function is normal. Bladder function is normal. Patient is not sexually active. Contraception method is abstinence. Postpartum depression screening: negative. ? ?Patient reports vomiting and pain in her RUQ after eating; she thinks she has gall stones.  ?The pregnancy intention screening data noted above was reviewed. Potential methods of contraception were discussed. The patient elected to proceed with LAM and NFP ? ? Edinburgh Postnatal Depression Scale - 05/20/21 1603   ? ?  ? Edinburgh Postnatal Depression Scale:  In the Past 7 Days  ? I have been able to laugh and see the funny side of things. 0   ? I have looked forward with enjoyment to things. 0   ? I have blamed myself unnecessarily when things went wrong. 0   ? I have been anxious or worried for no good reason. 0   ? I have felt scared or panicky for no good reason. 0   ? Things have been getting on top of me. 0   ? I have been so unhappy that I have had difficulty sleeping. 0   ? I have felt sad or miserable. 0   ? I have been so unhappy that I have been crying. 0   ? The thought of harming myself has occurred to me. 0   ? Edinburgh Postnatal Depression Scale Total 0   ? ?  ?  ? ?  ? ? ?Health Maintenance Due  ?Topic Date Due  ? Hepatitis C Screening  Never done  ? ? ?The following portions of the patient's history were reviewed and updated as appropriate: allergies, current medications, past family history, past medical history, past social history, past surgical history, and problem list. ? ?Review of Systems ?Pertinent  items are noted in HPI. ? ?Objective:  ?BP 109/74   Pulse 62   Wt 231 lb (104.8 kg)   LMP 07/25/2020   Breastfeeding Yes   BMI 35.65 kg/m?   ? ?General:  alert, cooperative, and no distress  ? Breasts:  normal  ?Lungs: clear to auscultation bilaterally  ?Heart:  regular rate and rhythm, S1, S2 normal, no murmur, click, rub or gallop  ?Abdomen: Soft     ?Wound NA  ?GU exam:  not indicated  ?     ?Assessment:  ? ? ?Healthy  postpartum exam.  ? ?Plan:  ? ?Essential components of care per ACOG recommendations: ? ?1.  Mood and well being: Patient with negative depression screening today. Reviewed local resources for support.  ?- Patient tobacco use? No.   ?- hx of drug use? No.   ? ?2. Infant care and feeding:  ?-Patient currently breastmilk feeding? Yes ?-Social determinants of health (SDOH) reviewed in EPIC. No concerns ? ?3. Sexuality, contraception and birth spacing ?- Patient does not want a pregnancy in the next year.  Desired family size is 1 children.  ?- Reviewed reproductive life planning. Reviewed contraceptive methods based on pt preferences and effectiveness.  Patient desired FAM or LAM today.   ?- Discussed birth spacing of 18 months ? ?4. Sleep and fatigue ?-Encouraged family/partner/community  support of 4 hrs of uninterrupted sleep to help with mood and fatigue ? ?5. Physical Recovery  ?- Discussed patients delivery and complications. She describes her labor as good. ?- Patient had a Vaginal, no problems at delivery. Patient had a  2nd degree feels like it is healed   laceration. Perineal healing reviewed. Patient expressed understanding ?- Patient has urinary incontinence? No. ?- Patient is not safe to resume physical and sexual activity ? ?6.  Health Maintenance ?- HM due items addressed No - needs pap in two months  ?- Last pap smear August 2020 at [facility] and was normal; she is due for pap in August 2023.  Pap smear not done at today's visit.  ?-Breast Cancer screening indicated? No.  ? ?7.  Chronic Disease/Pregnancy Condition follow up: Referral placed to GI for possible gallstone. Patient's BP is normal today and she would like to wean off medicine; discussed how to taper down and what to do if any side effects. ? ?- PCP follow up ? ?Starr Lake, CNM ?[facility]  ?

## 2021-05-21 LAB — CERVICOVAGINAL ANCILLARY ONLY
Bacterial Vaginitis (gardnerella): POSITIVE — AB
Comment: NEGATIVE
Comment: NEGATIVE
Trichomonas: NEGATIVE

## 2021-05-23 ENCOUNTER — Encounter (HOSPITAL_BASED_OUTPATIENT_CLINIC_OR_DEPARTMENT_OTHER): Payer: Self-pay | Admitting: Emergency Medicine

## 2021-05-23 ENCOUNTER — Emergency Department (HOSPITAL_BASED_OUTPATIENT_CLINIC_OR_DEPARTMENT_OTHER): Payer: Medicaid Other

## 2021-05-23 ENCOUNTER — Other Ambulatory Visit: Payer: Self-pay

## 2021-05-23 ENCOUNTER — Emergency Department (HOSPITAL_BASED_OUTPATIENT_CLINIC_OR_DEPARTMENT_OTHER)
Admission: EM | Admit: 2021-05-23 | Discharge: 2021-05-23 | Disposition: A | Discharge status: Home / Self Care | Payer: Medicaid Other | Attending: Emergency Medicine | Admitting: Emergency Medicine

## 2021-05-23 DIAGNOSIS — N939 Abnormal uterine and vaginal bleeding, unspecified: Secondary | ICD-10-CM | POA: Insufficient documentation

## 2021-05-23 DIAGNOSIS — K5900 Constipation, unspecified: Secondary | ICD-10-CM | POA: Insufficient documentation

## 2021-05-23 DIAGNOSIS — R1011 Right upper quadrant pain: Secondary | ICD-10-CM | POA: Diagnosis present

## 2021-05-23 DIAGNOSIS — K8050 Calculus of bile duct without cholangitis or cholecystitis without obstruction: Secondary | ICD-10-CM | POA: Insufficient documentation

## 2021-05-23 DIAGNOSIS — D72829 Elevated white blood cell count, unspecified: Secondary | ICD-10-CM | POA: Diagnosis not present

## 2021-05-23 DIAGNOSIS — N898 Other specified noninflammatory disorders of vagina: Secondary | ICD-10-CM | POA: Diagnosis not present

## 2021-05-23 LAB — CBC
HCT: 43.3 % (ref 36.0–46.0)
Hemoglobin: 14.1 g/dL (ref 12.0–15.0)
MCH: 26 pg (ref 26.0–34.0)
MCHC: 32.6 g/dL (ref 30.0–36.0)
MCV: 79.9 fL — ABNORMAL LOW (ref 80.0–100.0)
Platelets: 305 10*3/uL (ref 150–400)
RBC: 5.42 MIL/uL — ABNORMAL HIGH (ref 3.87–5.11)
RDW: 13 % (ref 11.5–15.5)
WBC: 12.1 10*3/uL — ABNORMAL HIGH (ref 4.0–10.5)
nRBC: 0 % (ref 0.0–0.2)

## 2021-05-23 LAB — URINALYSIS, ROUTINE W REFLEX MICROSCOPIC
Bilirubin Urine: NEGATIVE
Glucose, UA: NEGATIVE mg/dL
Ketones, ur: NEGATIVE mg/dL
Nitrite: NEGATIVE
Protein, ur: NEGATIVE mg/dL
Specific Gravity, Urine: 1.017 (ref 1.005–1.030)
pH: 5.5 (ref 5.0–8.0)

## 2021-05-23 LAB — COMPREHENSIVE METABOLIC PANEL
ALT: 44 U/L (ref 0–44)
AST: 32 U/L (ref 15–41)
Albumin: 4.6 g/dL (ref 3.5–5.0)
Alkaline Phosphatase: 95 U/L (ref 38–126)
Anion gap: 12 (ref 5–15)
BUN: 19 mg/dL (ref 6–20)
CO2: 24 mmol/L (ref 22–32)
Calcium: 10 mg/dL (ref 8.9–10.3)
Chloride: 102 mmol/L (ref 98–111)
Creatinine, Ser: 0.99 mg/dL (ref 0.44–1.00)
GFR, Estimated: >60 mL/min (ref >60)
Glucose, Bld: 90 mg/dL (ref 70–99)
Potassium: 3.9 mmol/L (ref 3.5–5.1)
Sodium: 138 mmol/L (ref 135–145)
Total Bilirubin: 0.6 mg/dL (ref 0.3–1.2)
Total Protein: 7.7 g/dL (ref 6.5–8.1)

## 2021-05-23 LAB — PREGNANCY, URINE: Preg Test, Ur: NEGATIVE

## 2021-05-23 LAB — LIPASE, BLOOD: Lipase: 35 U/L (ref 11–51)

## 2021-05-23 MED ORDER — ONDANSETRON HCL 4 MG PO TABS
4.0000 mg | ORAL_TABLET | Freq: Three times a day (TID) | ORAL | 0 refills | Status: DC | PRN
Start: 2021-05-23 — End: 2021-09-29

## 2021-05-23 MED ORDER — IOHEXOL 300 MG/ML  SOLN
100.0000 mL | Freq: Once | INTRAMUSCULAR | Status: AC | PRN
  Administered 2021-05-23: 100 mL via INTRAVENOUS

## 2021-05-23 NOTE — ED Provider Notes (Signed)
?[facility] DE[facility] ?Provider Note ? ? ?CSN: [id number] ?Arrival date & time: 05/23/21  1835 ? ?  ? ?History ? ?Chief Complaint  ?Patient presents with  ? Abdominal Pain  ? ? ?Melissa Whitney is a 41 y.o. female with pertinent history of pregnancy-induced hypertension, status post appendectomy, thyroid disease.  Patient reports that she had vaginal delivery 5 weeks prior.  Patient is currently breast-feeding. ? ?Patient presents to the emergency department with a chief complaint of upper abdominal pain.  Patient states that she has had 4 episodes of this pain over the last 8 days.  The first 3 episodes of pain started after patient ate fatty food.  Pain was to her right upper quadrant and radiated to her epigastric region.  Patient had associated nausea and vomiting with these reports of pain.  Patient states that today her pain was triggered by drinking a cup of water.  Pain lasted for approximately 3 to 4 hours before gradually resolving.  Patient reports that she did not have any nausea today as she has not had much to eat. ? ?Patient reports that she has been dealing with constipation intermittently since her pregnancy 5 weeks prior.  Patient states that she is dealing with some vaginal discharge and bleeding intermittently since her pregnancy 5 weeks prior. ? ?Patient denies any fever, chills, abdominal distention, constipation, blood in stool, melena, hematemesis, coffee-ground emesis, hematochezia, dysuria, urinary urgency, urinary frequency, vaginal pain, lightheadedness, syncope. ? ?Patient denies any illicit drug use or alcohol use. ? ? ?Abdominal Pain ?Associated symptoms: constipation, nausea, vaginal bleeding, vaginal discharge and vomiting   ?Associated symptoms: no chest pain, no chills, no diarrhea, no dysuria, no fever, no hematuria and no shortness of breath   ? ?  ? ?Home Medications ?Prior to Admission medications   ?Medication Sig Start Date End Date Taking? Authorizing  Provider  ?levothyroxine (SYNTHROID) 112 MCG tablet Take 1 tablet (112 mcg total) by mouth daily before breakfast. 03/02/21   Aletha Halim, MD  ?lisinopril (ZESTRIL) 20 MG tablet Take 1 tablet (20 mg total) by mouth daily. 04/17/21   Myrtis Ser, CNM  ?NIFEdipine (ADALAT CC) 60 MG 24 hr tablet TAKE ONE TABLET BY MOUTH DAILY 05/23/21   Myrtis Ser, CNM  ?Prenatal Vit-Fe Fumarate-FA (PRENATAL VITAMIN PO) Take by mouth.    [provider]  ?sertraline (ZOLOFT) 50 MG tablet Take 1 tablet (50 mg total) by mouth daily. 03/17/21   Gabriel Carina, CNM  ?   ? ?Allergies    ?Other   ? ?Review of Systems   ?Review of Systems  ?Constitutional:  Negative for chills and fever.  ?Eyes:  Negative for visual disturbance.  ?Respiratory:  Negative for shortness of breath.   ?Cardiovascular:  Negative for chest pain.  ?Gastrointestinal:  Positive for abdominal pain, constipation, nausea and vomiting. Negative for abdominal distention, anal bleeding, blood in stool, diarrhea and rectal pain.  ?Genitourinary:  Positive for vaginal bleeding and vaginal discharge. Negative for difficulty urinating, dysuria, flank pain, frequency, hematuria, urgency and vaginal pain.  ?Musculoskeletal:  Negative for back pain and neck pain.  ?Skin:  Negative for color change and rash.  ?Neurological:  Negative for dizziness, syncope, light-headedness and headaches.  ?Psychiatric/Behavioral:  Negative for confusion.   ? ?Physical Exam ?Updated Vital Signs ?BP 129/87   Pulse 74   Temp 98.4 ?F (36.9 ?C)   Resp 17   SpO2 98%   Breastfeeding Yes  ?Physical Exam ?Vitals and nursing note  reviewed.  ?Constitutional:   ?   General: She is not in acute distress. ?   Appearance: She is not ill-appearing, toxic-appearing or diaphoretic.  ?HENT:  ?   Head: Normocephalic.  ?Eyes:  ?   General: No scleral icterus.    ?   Right eye: No discharge.     ?   Left eye: No discharge.  ?Cardiovascular:  ?   Rate and Rhythm: Normal rate.  ?Pulmonary:  ?    Effort: Pulmonary effort is normal. No tachypnea, bradypnea or respiratory distress.  ?   Breath sounds: Normal breath sounds. No stridor.  ?Abdominal:  ?   General: Abdomen is protuberant. Bowel sounds are normal. There is no distension. There are no signs of injury.  ?   Palpations: Abdomen is soft. There is no mass or pulsatile mass.  ?   Tenderness: There is abdominal tenderness in the right upper quadrant and epigastric area. There is no right CVA tenderness, left CVA tenderness, guarding or rebound. Positive signs include Murphy's sign.  ?   Hernia: There is no hernia in the umbilical area or ventral area.  ?Skin: ?   General: Skin is warm and dry.  ?Neurological:  ?   General: No focal deficit present.  ?   Mental Status: She is alert.  ?Psychiatric:     ?   Behavior: Behavior is cooperative.  ? ? ?ED Results / Procedures / Treatments   ?Labs ?(all labs ordered are listed, but only abnormal results are displayed) ?Labs Reviewed  ?CBC - Abnormal; Notable for the following components:  ?    Result Value  ? WBC 12.1 (*)   ? RBC 5.42 (*)   ? MCV 79.9 (*)   ? All other components within normal limits  ?URINALYSIS, ROUTINE W REFLEX MICROSCOPIC - Abnormal; Notable for the following components:  ? Hgb urine dipstick SMALL (*)   ? Leukocytes,Ua MODERATE (*)   ? All other components within normal limits  ?LIPASE, BLOOD  ?COMPREHENSIVE METABOLIC PANEL  ?PREGNANCY, URINE  ? ? ?EKG ?None ? ?Radiology ?CT ABDOMEN PELVIS W CONTRAST ? ?Result Date: 05/23/2021 ?CLINICAL DATA:  Abdominal pain and vomiting, 5 weeks postpartum. EXAM: CT ABDOMEN AND PELVIS WITH CONTRAST TECHNIQUE: Multidetector CT imaging of the abdomen and pelvis was performed using the standard protocol following bolus administration of intravenous contrast. RADIATION DOSE REDUCTION: This exam was performed according to the departmental dose-optimization program which includes automated exposure control, adjustment of the mA and/or kV according to patient size  and/or use of iterative reconstruction technique. CONTRAST:  127m OMNIPAQUE IOHEXOL 300 MG/ML  SOLN COMPARISON:  None. FINDINGS: Lower chest: No acute abnormality. Hepatobiliary: No focal liver abnormality is seen. A thin layer of tiny gallstones is seen within the lumen of an otherwise normal-appearing gallbladder (coronal reformatted image 70, CT series 5). Pancreas: Unremarkable. No pancreatic ductal dilatation or surrounding inflammatory changes. Spleen: Small lobulated areas of parenchymal low attenuation are seen within the anterior and posterior aspect of the spleen. The largest measures approximately 1.9 cm x 1.2 cm. Adrenals/Urinary Tract: Adrenal glands are unremarkable. Kidneys are normal, without renal calculi, focal lesion, or hydronephrosis. Bladder is unremarkable. Stomach/Bowel: Stomach is within normal limits. The appendix is surgically absent. No evidence of bowel wall thickening, distention, or inflammatory changes. Vascular/Lymphatic: No significant vascular findings are present. No enlarged abdominal or pelvic lymph nodes. Reproductive: Uterus and bilateral adnexa are unremarkable. Other: No abdominal wall hernia or abnormality. No abdominopelvic ascites. Musculoskeletal: No acute or  significant osseous findings. IMPRESSION: 1. Small splenic cysts versus hemangiomas. Correlation with follow-up nonemergent splenic ultrasound is recommended. 2. Cholelithiasis. Electronically Signed   By: Virgina Norfolk M.D.   On: 05/23/2021 22:23   ? ?Procedures ?Procedures  ? ? ?Medications Ordered in ED ?Medications - No data to display ? ?ED Course/ Medical Decision Making/ A&P ?  ?                        ?Medical Decision Making ?Amount and/or Complexity of Data Reviewed ?Labs: ordered. ?Radiology: ordered. ? ?Risk ?Prescription drug management. ? ? ?Alert 41 year old female no acute distress, nontoxic-appearing.  Presents to the ED with a chief complaint of upper quadrant abdominal pain. ? ?Information is  obtained from patient and patient's mother at bedside.  Past medical records were reviewed including previous provider notes, labs, and imaging.  Patient has medical history as outlined in HPI which complicat

## 2021-05-23 NOTE — ED Notes (Signed)
Ultrasound appointment scheduled for 1300 on 05/24/2021. Called patient to advise of appointment. Left a message.  ?

## 2021-05-23 NOTE — ED Triage Notes (Signed)
Pt feels like she is having a gall bladder attack,has had 4 attacks (pain in abdomen, vomiting) patient has 87 week old baby and breastfeeding.  ?

## 2021-05-23 NOTE — Discharge Instructions (Addendum)
You came to the emergency department today to be evaluated for your abdominal pain after eating.  Your pain sounds consistent with biliary colic.  However to better evaluate your gallbladder and make sure you do not have an infection there we have scheduled a ultrasound to be performed in the outpatient setting tomorrow.   ? ?Additionally I have given you information to follow-up with the general surgery team in the outpatient setting.  Please call their office after receiving her ultrasound to schedule a follow-up appointment as needed. ? ?Get help right away if: ?Your skin or the whites of your eyes look yellow (jaundice). ?Your have tea-colored urine and light-colored stools (feces). ?You are dizzy or you faint. ?

## 2021-05-23 NOTE — ED Notes (Signed)
Pt ambulated to the bathroom to try and get a urine sample ?

## 2021-05-24 ENCOUNTER — Ambulatory Visit (HOSPITAL_BASED_OUTPATIENT_CLINIC_OR_DEPARTMENT_OTHER)
Admission: RE | Admit: 2021-05-24 | Discharge: 2021-05-24 | Disposition: A | Discharge status: Home / Self Care | Payer: Medicaid Other | Source: Ambulatory Visit | Attending: Emergency Medicine | Admitting: Emergency Medicine

## 2021-05-24 ENCOUNTER — Other Ambulatory Visit: Payer: Self-pay | Admitting: Student

## 2021-05-24 DIAGNOSIS — R1011 Right upper quadrant pain: Secondary | ICD-10-CM | POA: Diagnosis present

## 2021-05-24 DIAGNOSIS — K8020 Calculus of gallbladder without cholecystitis without obstruction: Secondary | ICD-10-CM | POA: Insufficient documentation

## 2021-05-24 MED ORDER — METRONIDAZOLE 500 MG PO TABS: 500.0000 mg | ORAL_TABLET | Freq: Two times a day (BID) | ORAL | 0 refills | Status: DC

## 2021-06-04 ENCOUNTER — Other Ambulatory Visit: Payer: Self-pay

## 2021-06-04 ENCOUNTER — Encounter (HOSPITAL_COMMUNITY): Payer: Self-pay | Admitting: General Surgery

## 2021-06-04 ENCOUNTER — Ambulatory Visit: Payer: Self-pay | Admitting: General Surgery

## 2021-06-04 NOTE — H&P (View-Only) (Signed)
?Chief Complaint: biliary colic ?  ?  ?  ?History of Present Illness: ?Melissa Whitney is a 41 y.o. female who is seen today as an office consultation at the request of Dr. Roslynn Amble for evaluation of biliary colic ?Marland Kitchen   ?Patient is a 41 year old female who comes in secondary to history of symptomatic cholelithiasis.  States this been going on for last couple months.  States that pain is become fairly frequent.  She did present to the ER recently secondary to abdominal pain, nausea vomiting.  She states of this was after eating high fatty meals. ?  ?On evaluation ER she underwent ultrasound and CT scan.  This was significant for cholelithiasis however no signs of acute cholecystitis.  Patient did have a a white count of 12.1.  This was postpartum.  Patient had no elevation in her LFTs. ?  ?  ?  ?  ?  ?Review of Systems: ?A complete review of systems was obtained from the patient.  I have reviewed this information and discussed as appropriate with the patient.  See HPI as well for other ROS. ?  ?Review of Systems  ?Constitutional: Negative for fever.  ?HENT: Negative for congestion.   ?Eyes: Negative for blurred vision.  ?Respiratory: Negative for cough, shortness of breath and wheezing.   ?Cardiovascular: Negative for chest pain and palpitations.  ?Gastrointestinal: Positive for abdominal pain, nausea and vomiting. Negative for heartburn.  ?Genitourinary: Negative for dysuria.  ?Musculoskeletal: Negative for myalgias.  ?Skin: Negative for rash.  ?Neurological: Negative for dizziness and headaches.  ?Psychiatric/Behavioral: Negative for depression and suicidal ideas.  ?All other systems reviewed and are negative. ?  ?  ?  ?Medical History: ?Past Medical History: ?Diagnosis        Date ?           Anxiety              ?           Thyroid disease            ?  ?  ?There is no problem list on file for this patient. ?  ?  ?Past Surgical History: ?Procedure       Laterality         Date ?           TONSILLECTOMY                   1989 ?           APPENDECTOMY                   2002 ?  ?  ?No Known Allergies ?  ?Current Outpatient Medications on File Prior to Visit ?Medication       Sig       Dispense         Refill ?           levothyroxine (SYNTHROID) 112 MCG tablet           Take by mouth                           ?           metroNIDAZOLE (FLAGYL) 500 MG tablet                               ?  ondansetron (ZOFRAN) 4 MG tablet                              ?           prenatal vit-CA-MIN-FE-FA (KPN ORAL) tablet        Take by mouth                           ?           sertraline (ZOLOFT) 50 MG tablet                                 ?  ?No current facility-administered medications on file prior to visit. ?  ?  ?Family History ?Problem           Relation           Age of Onset ?           Skin cancer     Mother   ?           Coronary Artery Disease (Blocked arteries around heart)     Father    ?           Hyperlipidemia (Elevated cholesterol)            Father    ?           High blood pressure (Hypertension)   Father    ?           Obesity            Father    ?           Skin cancer     Sister     ?  ?  ?Social History ?  ?Tobacco Use ?Smoking Status           Never ?Smokeless Tobacco   Never ?  ?  ?Social History ?  ?Socioeconomic History ?           Marital status:  Married ?Tobacco Use ?           Smoking status:          Never ?           Smokeless tobacco:    Never ?Vaping Use ?           Vaping Use:    Never used ?Substance and Sexual Activity ?           Alcohol use:    Never ?           Drug use:        Never ?  ?  ?Objective: ?  ?  ?Vitals: ?             06/04/21 0826 ?BP:      126/88 ?Pulse:  91 ?Temp:  36.7 ?C (98.1 ?F) ?SpO2:  98% ?Weight:            (!) 104.1 kg (229 lb 8 oz) ?Height: 171.5 cm (5' 7.5") ?  ?Body mass index is 35.41 kg/m?. ?  ?Physical Exam ?Constitutional:   ?   Appearance: Normal appearance.  ?HENT:  ?   Head: Normocephalic and atraumatic.  ?   Mouth/Throat:  ?   Mouth: Mucous membranes are  moist.  ?   Pharynx: Oropharynx is clear.  ?  Eyes:  ?   General: No scleral icterus. ?   Pupils: Pupils are equal, round, and reactive to light.  ?Cardiovascular:  ?   Rate and Rhythm: Normal rate and regular rhythm.  ?   Pulses: Normal pulses.  ?   Heart sounds: No murmur heard. ?  No friction rub. No gallop.  ?Pulmonary:  ?   Effort: Pulmonary effort is normal. No respiratory distress.  ?   Breath sounds: Normal breath sounds. No stridor.  ?Abdominal:  ?   General: Abdomen is flat.  ?   Tenderness: There is abdominal tenderness.  ?Musculoskeletal:     ?   General: No swelling.  ?Skin: ?   General: Skin is warm.  ?Neurological:  ?   General: No focal deficit present.  ?   Mental Status: She is alert and oriented to person, place, and time. Mental status is at baseline.  ?Psychiatric:     ?   Mood and Affect: Mood normal.     ?   Thought Content: Thought content normal.     ?   Judgment: Judgment normal.  ?  ?  ?  ?Assessment and Plan: ?Diagnoses and all orders for this visit: ?  ?Symptomatic cholelithiasis ?  ?  ?Melissa Whitney is a 41 y.o. female  ?  ?1.          We will proceed to the OR for a lap cholecystectomy. ?2.         All risks and benefits were discussed with the patient to generally include: infection, bleeding, possible need for post op ERCP, damage to the bile ducts, and bile leak. Alternatives were offered and described.  All questions were answered and the patient voiced understanding of the procedure and wishes to proceed at this point with a laparoscopic cholecystectomy ?  ?  ?  ?  ?  ?No follow-ups on file. ?  ?Ralene Ok, MD, FACS ?[facility], [place] ?

## 2021-06-04 NOTE — H&P (Signed)
?Chief Complaint: biliary colic ?  ?  ?  ?History of Present Illness: ?Melissa Whitney is a 41 y.o. female who is seen today as an office consultation at the request of Dr. Roslynn Amble for evaluation of biliary colic ?Melissa Whitney   ?Patient is a 41 year old female who comes in secondary to history of symptomatic cholelithiasis.  States this been going on for last couple months.  States that pain is become fairly frequent.  She did present to the ER recently secondary to abdominal pain, nausea vomiting.  She states of this was after eating high fatty meals. ?  ?On evaluation ER she underwent ultrasound and CT scan.  This was significant for cholelithiasis however no signs of acute cholecystitis.  Patient did have a a white count of 12.1.  This was postpartum.  Patient had no elevation in her LFTs. ?  ?  ?  ?  ?  ?Review of Systems: ?A complete review of systems was obtained from the patient.  I have reviewed this information and discussed as appropriate with the patient.  See HPI as well for other ROS. ?  ?Review of Systems  ?Constitutional: Negative for fever.  ?HENT: Negative for congestion.   ?Eyes: Negative for blurred vision.  ?Respiratory: Negative for cough, shortness of breath and wheezing.   ?Cardiovascular: Negative for chest pain and palpitations.  ?Gastrointestinal: Positive for abdominal pain, nausea and vomiting. Negative for heartburn.  ?Genitourinary: Negative for dysuria.  ?Musculoskeletal: Negative for myalgias.  ?Skin: Negative for rash.  ?Neurological: Negative for dizziness and headaches.  ?Psychiatric/Behavioral: Negative for depression and suicidal ideas.  ?All other systems reviewed and are negative. ?  ?  ?  ?Medical History: ?Past Medical History: ?Diagnosis        Date ?           Anxiety              ?           Thyroid disease            ?  ?  ?There is no problem list on file for this patient. ?  ?  ?Past Surgical History: ?Procedure       Laterality         Date ?           TONSILLECTOMY                   1989 ?           APPENDECTOMY                   2002 ?  ?  ?No Known Allergies ?  ?Current Outpatient Medications on File Prior to Visit ?Medication       Sig       Dispense         Refill ?           levothyroxine (SYNTHROID) 112 MCG tablet           Take by mouth                           ?           metroNIDAZOLE (FLAGYL) 500 MG tablet                               ?  ondansetron (ZOFRAN) 4 MG tablet                              ?           prenatal vit-CA-MIN-FE-FA (KPN ORAL) tablet        Take by mouth                           ?           sertraline (ZOLOFT) 50 MG tablet                                 ?  ?No current facility-administered medications on file prior to visit. ?  ?  ?Family History ?Problem           Relation           Age of Onset ?           Skin cancer     Mother   ?           Coronary Artery Disease (Blocked arteries around heart)     Father    ?           Hyperlipidemia (Elevated cholesterol)            Father    ?           High blood pressure (Hypertension)   Father    ?           Obesity            Father    ?           Skin cancer     Sister     ?  ?  ?Social History ?  ?Tobacco Use ?Smoking Status           Never ?Smokeless Tobacco   Never ?  ?  ?Social History ?  ?Socioeconomic History ?           Marital status:  Married ?Tobacco Use ?           Smoking status:          Never ?           Smokeless tobacco:    Never ?Vaping Use ?           Vaping Use:    Never used ?Substance and Sexual Activity ?           Alcohol use:    Never ?           Drug use:        Never ?  ?  ?Objective: ?  ?  ?Vitals: ?             06/04/21 0826 ?BP:      126/88 ?Pulse:  91 ?Temp:  36.7 ?C (98.1 ?F) ?SpO2:  98% ?Weight:            (!) 104.1 kg (229 lb 8 oz) ?Height: 171.5 cm (5' 7.5") ?  ?Body mass index is 35.41 kg/m?. ?  ?Physical Exam ?Constitutional:   ?   Appearance: Normal appearance.  ?HENT:  ?   Head: Normocephalic and atraumatic.  ?   Mouth/Throat:  ?   Mouth: Mucous membranes are  moist.  ?   Pharynx: Oropharynx is clear.  ?  Eyes:  ?   General: No scleral icterus. ?   Pupils: Pupils are equal, round, and reactive to light.  ?Cardiovascular:  ?   Rate and Rhythm: Normal rate and regular rhythm.  ?   Pulses: Normal pulses.  ?   Heart sounds: No murmur heard. ?  No friction rub. No gallop.  ?Pulmonary:  ?   Effort: Pulmonary effort is normal. No respiratory distress.  ?   Breath sounds: Normal breath sounds. No stridor.  ?Abdominal:  ?   General: Abdomen is flat.  ?   Tenderness: There is abdominal tenderness.  ?Musculoskeletal:     ?   General: No swelling.  ?Skin: ?   General: Skin is warm.  ?Neurological:  ?   General: No focal deficit present.  ?   Mental Status: She is alert and oriented to person, place, and time. Mental status is at baseline.  ?Psychiatric:     ?   Mood and Affect: Mood normal.     ?   Thought Content: Thought content normal.     ?   Judgment: Judgment normal.  ?  ?  ?  ?Assessment and Plan: ?Diagnoses and all orders for this visit: ?  ?Symptomatic cholelithiasis ?  ?  ?Melissa Whitney is a 41 y.o. female  ?  ?1.          We will proceed to the OR for a lap cholecystectomy. ?2.         All risks and benefits were discussed with the patient to generally include: infection, bleeding, possible need for post op ERCP, damage to the bile ducts, and bile leak. Alternatives were offered and described.  All questions were answered and the patient voiced understanding of the procedure and wishes to proceed at this point with a laparoscopic cholecystectomy ?  ?  ?  ?  ?  ?No follow-ups on file. ?  ?Ralene Ok, MD, FACS ?[facility], [place] ?

## 2021-06-04 NOTE — Progress Notes (Addendum)
For Short Stay: ?[facility] appointment date:N/A ?Date of COVID positive in last 27 days:N/A ? ?Bowel Prep reminder:N/A ? ? ?For Anesthesia: ?PCP - Reynold Bowen, MD ?Cardiologist - N/A ? ?Chest x-ray - N/A ?EKG - 05/25/21 in epic ?Stress Test - N/A ?ECHO - N/A ?Cardiac Cath - N/A ?Pacemaker/ICD device last checked:N/A Pacemaker orders received:N/A ?Device Rep notified:N/A ? ?Spinal Cord Stimulator: N/A ? ?Sleep Study - N/A ?CPAP -  ? ?Fasting Blood Sugar - N/A ?Checks Blood Sugar __N/A___ times a day ?Date and result of last Hgb A1c- ? ?Blood Thinner Instructions: N/A ?Aspirin Instructions: N/A ?Last Dose: N/A ? ?Activity level:  Able to exercise without chest pain and/or shortness of breath ?     ? ?Anesthesia review: N/A ? ?Patient denies shortness of breath, fever, cough and chest pain at PAT appointment ? ? ?Patient verbalized understanding of instructions that were given to them at the PAT appointment. Patient was also instructed that they will need to review over the PAT instructions again at home before surgery.  ?

## 2021-06-09 ENCOUNTER — Ambulatory Visit (HOSPITAL_BASED_OUTPATIENT_CLINIC_OR_DEPARTMENT_OTHER): Payer: Medicaid Other | Admitting: Certified Registered Nurse Anesthetist

## 2021-06-09 ENCOUNTER — Encounter (HOSPITAL_COMMUNITY): Payer: Self-pay | Admitting: General Surgery

## 2021-06-09 ENCOUNTER — Ambulatory Visit (HOSPITAL_COMMUNITY): Payer: Medicaid Other | Admitting: Certified Registered Nurse Anesthetist

## 2021-06-09 ENCOUNTER — Other Ambulatory Visit: Payer: Self-pay

## 2021-06-09 ENCOUNTER — Ambulatory Visit (HOSPITAL_COMMUNITY)
Admission: RE | Admit: 2021-06-09 | Discharge: 2021-06-09 | Disposition: A | Discharge status: Home / Self Care | Payer: Medicaid Other | Attending: General Surgery | Admitting: General Surgery

## 2021-06-09 ENCOUNTER — Encounter (HOSPITAL_COMMUNITY)
Admission: RE | Disposition: A | Discharge status: Home / Self Care | Payer: Self-pay | Source: Home / Self Care | Attending: General Surgery

## 2021-06-09 DIAGNOSIS — I10 Essential (primary) hypertension: Secondary | ICD-10-CM | POA: Insufficient documentation

## 2021-06-09 DIAGNOSIS — E039 Hypothyroidism, unspecified: Secondary | ICD-10-CM | POA: Insufficient documentation

## 2021-06-09 DIAGNOSIS — Z01818 Encounter for other preprocedural examination: Secondary | ICD-10-CM

## 2021-06-09 DIAGNOSIS — K8010 Calculus of gallbladder with chronic cholecystitis without obstruction: Secondary | ICD-10-CM | POA: Insufficient documentation

## 2021-06-09 DIAGNOSIS — Z87891 Personal history of nicotine dependence: Secondary | ICD-10-CM | POA: Diagnosis not present

## 2021-06-09 DIAGNOSIS — K8020 Calculus of gallbladder without cholecystitis without obstruction: Secondary | ICD-10-CM | POA: Diagnosis not present

## 2021-06-09 HISTORY — DX: Gastro-esophageal reflux disease without esophagitis: K21.9

## 2021-06-09 HISTORY — DX: Polycystic ovarian syndrome: E28.2

## 2021-06-09 HISTORY — PX: CHOLECYSTECTOMY: SHX55

## 2021-06-09 HISTORY — DX: Hypothyroidism, unspecified: E03.9

## 2021-06-09 HISTORY — DX: Personal history of other diseases of the nervous system and sense organs: Z86.69

## 2021-06-09 HISTORY — DX: Calculus of gallbladder without cholecystitis without obstruction: K80.20

## 2021-06-09 HISTORY — DX: Syncope and collapse: R55

## 2021-06-09 LAB — PREGNANCY, URINE: Preg Test, Ur: NEGATIVE

## 2021-06-09 SURGERY — LAPAROSCOPIC CHOLECYSTECTOMY
Anesthesia: General | Site: Abdomen

## 2021-06-09 MED ORDER — HYDROMORPHONE HCL 1 MG/ML IJ SOLN
INTRAMUSCULAR | Status: AC
  Administered 2021-06-09: 0.5 mg via INTRAVENOUS
  Filled 2021-06-09: qty 1

## 2021-06-09 MED ORDER — FENTANYL CITRATE (PF) 100 MCG/2ML IJ SOLN
INTRAMUSCULAR | Status: DC | PRN
  Administered 2021-06-09: 10 ug via INTRAVENOUS

## 2021-06-09 MED ORDER — LACTATED RINGERS IV SOLN: INTRAVENOUS | Status: DC

## 2021-06-09 MED ORDER — KETOROLAC TROMETHAMINE 30 MG/ML IJ SOLN
30.0000 mg | Freq: Once | INTRAMUSCULAR | Status: AC
  Administered 2021-06-09: 30 mg via INTRAVENOUS

## 2021-06-09 MED ORDER — ORAL CARE MOUTH RINSE: 15.0000 mL | Freq: Once | OROMUCOSAL | Status: AC

## 2021-06-09 MED ORDER — HYDROMORPHONE HCL 1 MG/ML IJ SOLN
0.2500 mg | INTRAMUSCULAR | Status: DC | PRN
  Administered 2021-06-09: 0.5 mg via INTRAVENOUS

## 2021-06-09 MED ORDER — ONDANSETRON HCL 4 MG/2ML IJ SOLN
INTRAMUSCULAR | Status: DC | PRN
  Administered 2021-06-09: 4 mg via INTRAVENOUS

## 2021-06-09 MED ORDER — BUPIVACAINE-EPINEPHRINE (PF) 0.25% -1:200000 IJ SOLN
INTRAMUSCULAR | Status: AC
  Filled 2021-06-09: qty 30

## 2021-06-09 MED ORDER — HYDROMORPHONE HCL 2 MG/ML IJ SOLN
INTRAMUSCULAR | Status: AC
  Filled 2021-06-09: qty 1

## 2021-06-09 MED ORDER — ENSURE PRE-SURGERY PO LIQD
296.0000 mL | Freq: Once | ORAL | Status: DC
Start: 2021-06-09 — End: 2021-06-09
  Filled 2021-06-09: qty 296

## 2021-06-09 MED ORDER — TRAMADOL HCL 50 MG PO TABS: 50.0000 mg | ORAL_TABLET | Freq: Four times a day (QID) | ORAL | 0 refills | Status: DC | PRN

## 2021-06-09 MED ORDER — CEFAZOLIN SODIUM-DEXTROSE 2-4 GM/100ML-% IV SOLN
2.0000 g | INTRAVENOUS | Status: AC
  Administered 2021-06-09: 2 g via INTRAVENOUS
  Filled 2021-06-09: qty 100

## 2021-06-09 MED ORDER — CHLORHEXIDINE GLUCONATE CLOTH 2 % EX PADS: 6.0000 | MEDICATED_PAD | Freq: Once | CUTANEOUS | Status: DC

## 2021-06-09 MED ORDER — ACETAMINOPHEN 500 MG PO TABS
1000.0000 mg | ORAL_TABLET | ORAL | Status: AC
  Administered 2021-06-09: 1000 mg via ORAL
  Filled 2021-06-09: qty 2

## 2021-06-09 MED ORDER — CHLORHEXIDINE GLUCONATE 0.12 % MT SOLN
15.0000 mL | Freq: Once | OROMUCOSAL | Status: AC
  Administered 2021-06-09: 15 mL via OROMUCOSAL

## 2021-06-09 MED ORDER — PROPOFOL 10 MG/ML IV BOLUS
INTRAVENOUS | Status: DC | PRN
  Administered 2021-06-09: 170 mg via INTRAVENOUS

## 2021-06-09 MED ORDER — LACTATED RINGERS IV SOLN
INTRAVENOUS | Status: DC | PRN
Start: 2021-06-09 — End: 2021-06-09

## 2021-06-09 MED ORDER — ROCURONIUM BROMIDE 10 MG/ML (PF) SYRINGE
PREFILLED_SYRINGE | INTRAVENOUS | Status: DC | PRN
Start: 2021-06-09 — End: 2021-06-09
  Administered 2021-06-09: 50 mg via INTRAVENOUS

## 2021-06-09 MED ORDER — FENTANYL CITRATE (PF) 100 MCG/2ML IJ SOLN
INTRAMUSCULAR | Status: AC
  Filled 2021-06-09: qty 2

## 2021-06-09 MED ORDER — DEXAMETHASONE SODIUM PHOSPHATE 10 MG/ML IJ SOLN
INTRAMUSCULAR | Status: DC | PRN
Start: 2021-06-09 — End: 2021-06-09
  Administered 2021-06-09: 10 mg via INTRAVENOUS

## 2021-06-09 MED ORDER — HYDROMORPHONE HCL 1 MG/ML IJ SOLN
INTRAMUSCULAR | Status: DC | PRN
  Administered 2021-06-09 (×2): 1 mg via INTRAVENOUS

## 2021-06-09 MED ORDER — MIDAZOLAM HCL 5 MG/5ML IJ SOLN
INTRAMUSCULAR | Status: DC | PRN
  Administered 2021-06-09: 2 mg via INTRAVENOUS

## 2021-06-09 MED ORDER — SUGAMMADEX SODIUM 200 MG/2ML IV SOLN
INTRAVENOUS | Status: DC | PRN
  Administered 2021-06-09: 250 mg via INTRAVENOUS

## 2021-06-09 MED ORDER — BUPIVACAINE-EPINEPHRINE (PF) 0.25% -1:200000 IJ SOLN
INTRAMUSCULAR | Status: DC | PRN
  Administered 2021-06-09: 13 mL

## 2021-06-09 MED ORDER — EPHEDRINE 5 MG/ML INJ
INTRAVENOUS | Status: AC
  Filled 2021-06-09: qty 5

## 2021-06-09 MED ORDER — LIDOCAINE 2% (20 MG/ML) 5 ML SYRINGE
INTRAMUSCULAR | Status: DC | PRN
  Administered 2021-06-09: 60 mg via INTRAVENOUS

## 2021-06-09 MED ORDER — KETOROLAC TROMETHAMINE 30 MG/ML IJ SOLN
INTRAMUSCULAR | Status: AC
  Filled 2021-06-09: qty 1

## 2021-06-09 MED ORDER — LACTATED RINGERS IR SOLN
Status: DC | PRN
  Administered 2021-06-09: 1000 mL

## 2021-06-09 MED ORDER — MIDAZOLAM HCL 2 MG/2ML IJ SOLN
INTRAMUSCULAR | Status: AC
  Filled 2021-06-09: qty 2

## 2021-06-09 SURGICAL SUPPLY — 42 items
ADH SKN CLS APL DERMABOND .7 (GAUZE/BANDAGES/DRESSINGS) ×1
APL PRP STRL LF DISP 70% ISPRP (MISCELLANEOUS) ×1
APPLIER CLIP 5 13 M/L LIGAMAX5 (MISCELLANEOUS)
APR CLP MED LRG 5 ANG JAW (MISCELLANEOUS)
BAG COUNTER SPONGE SURGICOUNT (BAG) IMPLANT
BAG SPEC RTRVL 10 TROC 200 (ENDOMECHANICALS)
BAG SPNG CNTER NS LX DISP (BAG)
CABLE HIGH FREQUENCY MONO STRZ (ELECTRODE) ×2 IMPLANT
CHLORAPREP W/TINT 26 (MISCELLANEOUS) ×2 IMPLANT
CLIP APPLIE 5 13 M/L LIGAMAX5 (MISCELLANEOUS) IMPLANT
CLIP LIGATING HEMO O LOK GREEN (MISCELLANEOUS) ×2 IMPLANT
COVER MAYO STAND XLG (MISCELLANEOUS) ×2 IMPLANT
COVER TRANSDUCER ULTRASND (DRAPES) IMPLANT
DERMABOND ADVANCED (GAUZE/BANDAGES/DRESSINGS) ×1
DERMABOND ADVANCED .7 DNX12 (GAUZE/BANDAGES/DRESSINGS) ×1 IMPLANT
DRAPE C-ARM 42X120 X-RAY (DRAPES) ×2 IMPLANT
ELECT REM PT RETURN 15FT ADLT (MISCELLANEOUS) ×2 IMPLANT
GAUZE SPONGE 2X2 8PLY STRL LF (GAUZE/BANDAGES/DRESSINGS) ×1 IMPLANT
GLOVE BIO SURGEON STRL SZ7.5 (GLOVE) ×2 IMPLANT
GOWN STRL REUS W/ TWL XL LVL3 (GOWN DISPOSABLE) ×3 IMPLANT
GOWN STRL REUS W/TWL XL LVL3 (GOWN DISPOSABLE) ×6
GRASPER SUT TROCAR 14GX15 (MISCELLANEOUS) IMPLANT
IRRIG SUCT STRYKERFLOW 2 WTIP (MISCELLANEOUS) ×2
IRRIGATION SUCT STRKRFLW 2 WTP (MISCELLANEOUS) ×1 IMPLANT
KIT BASIN OR (CUSTOM PROCEDURE TRAY) ×2 IMPLANT
KIT TURNOVER KIT A (KITS) IMPLANT
NDL INSUFFLATION 14GA 120MM (NEEDLE) ×1 IMPLANT
NEEDLE INSUFFLATION 14GA 120MM (NEEDLE) ×2 IMPLANT
PENCIL SMOKE EVACUATOR (MISCELLANEOUS) IMPLANT
POUCH RETRIEVAL ECOSAC 10 (ENDOMECHANICALS) IMPLANT
POUCH RETRIEVAL ECOSAC 10MM (ENDOMECHANICALS)
SCISSORS LAP 5X35 DISP (ENDOMECHANICALS) ×2 IMPLANT
SET CHOLANGIOGRAPH MIX (MISCELLANEOUS) ×2 IMPLANT
SET TUBE SMOKE EVAC HIGH FLOW (TUBING) ×2 IMPLANT
SLEEVE XCEL OPT CAN 5 100 (ENDOMECHANICALS) ×2 IMPLANT
SPIKE FLUID TRANSFER (MISCELLANEOUS) ×2 IMPLANT
SPONGE GAUZE 2X2 STER 10/PKG (GAUZE/BANDAGES/DRESSINGS) ×1
SUT MNCRL AB 4-0 PS2 18 (SUTURE) ×2 IMPLANT
TOWEL OR 17X26 10 PK STRL BLUE (TOWEL DISPOSABLE) ×2 IMPLANT
TRAY LAPAROSCOPIC (CUSTOM PROCEDURE TRAY) ×2 IMPLANT
TROCAR BLADELESS OPT 5 100 (ENDOMECHANICALS) ×2 IMPLANT
TROCAR XCEL NON-BLD 11X100MML (ENDOMECHANICALS) ×2 IMPLANT

## 2021-06-09 NOTE — Interval H&P Note (Signed)
History and Physical Interval Note: ? ?06/09/2021 ?8:36 AM ? ?Melissa Whitney  has presented today for surgery, with the diagnosis of GALLSTONES.  The various methods of treatment have been discussed with the patient and family. After consideration of risks, benefits and other options for treatment, the patient has consented to  Procedure(s): ?LAPAROSCOPIC CHOLECYSTECTOMY (N/A) as a surgical intervention.  The patient's history has been reviewed, patient examined, no change in status, stable for surgery.  I have reviewed the patient's chart and labs.  Questions were answered to the patient's satisfaction.   ? ? ?Ralene Ok ? ? ?

## 2021-06-09 NOTE — Anesthesia Postprocedure Evaluation (Signed)
Anesthesia Post Note ? ?Patient: Melissa Whitney ? ?Procedure(s) Performed: LAPAROSCOPIC CHOLECYSTECTOMY (Abdomen) ? ?  ? ?Patient location during evaluation: PACU ?Anesthesia Type: General ?Level of consciousness: awake ?Pain management: pain level controlled ?Vital Signs Assessment: post-procedure vital signs reviewed and stable ?Respiratory status: spontaneous breathing ?Cardiovascular status: stable ?Postop Assessment: no apparent nausea or vomiting ?Anesthetic complications: no ? ? ?No notable events documented. ? ?Last Vitals:  ?Vitals:  ? 06/09/21 0825 06/09/21 1115  ?BP: (!) 141/92 (!) 160/102  ?Pulse: 67 85  ?Resp: 16 15  ?Temp: 36.9 ?C 36.5 ?C  ?SpO2: 98% 99%  ?  ?Last Pain:  ?Vitals:  ? 06/09/21 1136  ?TempSrc:   ?PainSc: 8   ? ? ?  ?  ?  ?  ?  ?  ? ?Giovoni Bunch ? ? ? ? ?

## 2021-06-09 NOTE — Discharge Instructions (Signed)
[facility] ______[facility] LAPAROSCOPIC SURGERY: POST OP INSTRUCTIONS Always review your discharge instruction sheet given to you by the facility where your surgery was performed. IF YOU HAVE DISABILITY OR FAMILY LEAVE FORMS, YOU MUST BRING THEM TO THE OFFICE FOR PROCESSING.   DO NOT GIVE THEM TO YOUR DOCTOR.  A prescription for pain medication may be given to you upon discharge.  Take your pain medication as prescribed, if needed.  If narcotic pain medicine is not needed, then you may take acetaminophen (Tylenol) or ibuprofen (Advil) as needed. Take your usually prescribed medications unless otherwise directed. If you need a refill on your pain medication, please contact your pharmacy.  They will contact our office to request authorization. Prescriptions will not be filled after 5pm or on week-ends. You should follow a light diet the first few days after arrival home, such as soup and crackers, etc.  Be sure to include lots of fluids daily. Most patients will experience some swelling and bruising in the area of the incisions.  Ice packs will help.  Swelling and bruising can take several days to resolve.  It is common to experience some constipation if taking pain medication after surgery.  Increasing fluid intake and taking a stool softener (such as Colace) will usually help or prevent this problem from occurring.  A mild laxative (Milk of Magnesia or Miralax) should be taken according to package instructions if there are no bowel movements after 48 hours. Unless discharge instructions indicate otherwise, you may remove your bandages 24-48 hours after surgery, and you may shower at that time.  You may have steri-strips (small skin tapes) in place directly over the incision.  These strips should be left on the skin for 7-10 days.  If your surgeon used skin glue on the incision, you may shower in 24 hours.  The glue will flake off over the next 2-3 weeks.  Any sutures or staples will be  removed at the office during your follow-up visit. ACTIVITIES:  You may resume regular (light) daily activities beginning the next day--such as daily self-care, walking, climbing stairs--gradually increasing activities as tolerated.  You may have sexual intercourse when it is comfortable.  Refrain from any heavy lifting or straining until approved by your doctor. You may drive when you are no longer taking prescription pain medication, you can comfortably wear a seatbelt, and you can safely maneuver your car and apply brakes. RETURN TO WORK:  __________________________________________________________ You should see your doctor in the office for a follow-up appointment approximately 2-3 weeks after your surgery.  Make sure that you call for this appointment within a day or two after you arrive home to insure a convenient appointment time. OTHER INSTRUCTIONS: __________________________________________________________________________________________________________________________ __________________________________________________________________________________________________________________________ WHEN TO CALL YOUR DOCTOR: Fever over 101.0 Inability to urinate Continued bleeding from incision. Increased pain, redness, or drainage from the incision. Increasing abdominal pain  The clinic staff is available to answer your questions during regular business hours.  Please don't hesitate to call and ask to speak to one of the nurses for clinical concerns.  If you have a medical emergency, go to the nearest emergency room or call 911.  A surgeon from [facility] is always on call at the hospital. [address], [address], [place], [place]  [address] ? [address] [address], [place], [place]   [address] [phone] ? [phone] ? FAX [phone] Web site: [email or web address]  

## 2021-06-09 NOTE — Transfer of Care (Signed)
Immediate Anesthesia Transfer of Care Note ? ?Patient: Melissa Whitney ? ?Procedure(s) Performed: LAPAROSCOPIC CHOLECYSTECTOMY (Abdomen) ? ?Patient Location: PACU ? ?Anesthesia Type:General ? ?Level of Consciousness: awake, alert  and oriented ? ?Airway & Oxygen Therapy: Patient Spontanous Breathing and Patient connected to face mask ? ?Post-op Assessment: Report given to RN and Post -op Vital signs reviewed and stable ? ?Post vital signs: Reviewed and stable ? ?Last Vitals:  ?Vitals Value Taken Time  ?BP 154/115 06/09/21 1130  ?Temp 36.5 ?C 06/09/21 1115  ?Pulse 60 06/09/21 1132  ?Resp 13 06/09/21 1132  ?SpO2 97 % 06/09/21 1132  ?Vitals shown include unvalidated device data. ? ?Last Pain:  ?Vitals:  ? 06/09/21 1124  ?TempSrc:   ?PainSc: 8   ?   ? ?Patients Stated Pain Goal: 3 (06/09/21 1610) ? ?Complications: No notable events documented. ?

## 2021-06-09 NOTE — Op Note (Signed)
06/09/2021 ? ?11:18 AM ? ?PATIENT:  Melissa Whitney  41 y.o. female ? ?PRE-OPERATIVE DIAGNOSIS:  GALLSTONES ? ?POST-OPERATIVE DIAGNOSIS:  GALLSTONES ? ?PROCEDURE:  Procedure(s): ?LAPAROSCOPIC CHOLECYSTECTOMY (N/A) ? ?SURGEON:  Surgeon(s) and Role: ?   Ralene Ok, MD - Primary ? ?ANESTHESIA:   local and general ? ?EBL:  15 mL  ? ?BLOOD ADMINISTERED:none ? ?DRAINS: none  ? ?LOCAL MEDICATIONS USED:  BUPIVICAINE  ? ?SPECIMEN:  Source of Specimen:  gallbladder ? ?DISPOSITION OF SPECIMEN:  PATHOLOGY ? ?COUNTS:  YES ? ?TOURNIQUET:  * No tourniquets in log * ? ?DICTATION: .Dragon Dictation ? ? ?EBL: <5cc  ? ?Complications: none  ? ?Counts: reported as correct x 2  ? ?Findings:chronically inflamed gallbladder and gallstones ? ?Indications for procedure: Pt is a 20F with RUQ pain and seen to have gallstones.  ? ?Details of the procedure: The patient was taken to the operating and placed in the supine position with bilateral SCDs in place. A time out was called and all facts were verified. A pneumoperitoneum was obtained via A Veress needle technique to a pressure of 73m of mercury. A 522mtrochar was then placed in the right upper quadrant under visualization, and there were no injuries to any abdominal organs. A 11 mm port was then placed in the umbilical region after infiltrating with local anesthesia under direct visualization. A second epigastric port was placed under direct visualization.  ? ?The gallbladder was identified and retracted, the peritoneum was then sharply dissected from the gallbladder and this dissection was carried down to Calot's triangle. The cystic duct was identified and dissected circumferentially and seen going into the gallbladder 360?. Marland KitchenThe cystic artery was dissected away from the surrounding tissues.   The critical angle was obtained.  ? ?2 clips were placed proximally one distally and the cystic duct transected. The cystic artery was identified and 2 clips placed proximally and one  distally and transected. We then proceeded to remove the gallbladder off the hepatic fossa with Bovie cautery. A retrieval bag was then placed in the abdomen and gallbladder placed in the bag. The hepatic fossa was then reexamined and hemostasis was achieved with Bovie cautery and was excellent at this portion of the case. The subhepatic fossa and perihepatic fossa was then irrigated until the effluent was clear. The specimen bag and specimen were removed from the abdominal cavity. ? ?The 11 mm trocar fascia was reapproximated with the Endo Close #1 Vicryl x1. The pneumoperitoneum was evacuated and all trochars removed under direct visulalization. The skin was then closed with 4-0 Monocryl and the skin dressed with Dermabond. The patient was awaken from general anesthesia and taken to the recovery room in stable condition. ? ? ?PLAN OF CARE: Discharge to home after PACU ? ?PATIENT DISPOSITION:  PACU - hemodynamically stable. ?  ?Delay start of Pharmacological VTE agent (>24hrs) due to surgical blood loss or risk of bleeding: not applicable ? ?

## 2021-06-09 NOTE — Anesthesia Postprocedure Evaluation (Signed)
Anesthesia Post Note ? ?Patient: Melissa Whitney ? ?Procedure(s) Performed: LAPAROSCOPIC CHOLECYSTECTOMY (Abdomen) ? ?  ? ?Patient location during evaluation: PACU ?Anesthesia Type: General ?Level of consciousness: awake ?Pain management: pain level controlled ?Respiratory status: spontaneous breathing ?Cardiovascular status: stable ?Postop Assessment: no apparent nausea or vomiting ?Anesthetic complications: no ? ? ?No notable events documented. ? ?Last Vitals:  ?Vitals:  ? 06/09/21 1130 06/09/21 1145  ?BP: (!) 154/115 (!) 150/106  ?Pulse: (!) 55 64  ?Resp: 12 19  ?Temp:    ?SpO2: 98% 95%  ?  ?Last Pain:  ?Vitals:  ? 06/09/21 1147  ?TempSrc:   ?PainSc: 6   ? ? ?  ?  ?  ?  ?  ?  ? ?Yovan Leeman ? ? ? ? ?

## 2021-06-09 NOTE — Anesthesia Preprocedure Evaluation (Signed)
Anesthesia Evaluation  ?Patient identified by MRN, date of birth, ID band ?Patient awake ? ? ? ?Reviewed: ?Allergy & Precautions, NPO status , Patient's Chart, lab work & pertinent test results ? ?Airway ?Mallampati: II ? ?TM Distance: >3 FB ? ? ? ? Dental ?  ?Pulmonary ?neg pulmonary ROS, former smoker,  ?  ?breath sounds clear to auscultation ? ? ? ? ? ? Cardiovascular ?hypertension,  ?Rhythm:Regular  ? ?  ?Neuro/Psych ?  ? GI/Hepatic ?Neg liver ROS, GERD  ,  ?Endo/Other  ?negative endocrine ROSHypothyroidism  ? Renal/GU ?negative Renal ROS  ? ?  ?Musculoskeletal ? ? Abdominal ?  ?Peds ? Hematology ?  ?Anesthesia Other Findings ? ? Reproductive/Obstetrics ? ?  ? ? ? ? ? ? ? ? ? ? ? ? ? ?  ?  ? ? ? ? ? ? ? ? ?Anesthesia Physical ?Anesthesia Plan ? ?ASA: 3 ? ?Anesthesia Plan: General  ? ?Post-op Pain Management:   ? ?Induction: Intravenous ? ?PONV Risk Score and Plan: Ondansetron, Dexamethasone and Midazolam ? ?Airway Management Planned: Oral ETT ? ?Additional Equipment:  ? ?Intra-op Plan:  ? ?Post-operative Plan: Extubation in OR ? ?Informed Consent: I have reviewed the patients History and Physical, chart, labs and discussed the procedure including the risks, benefits and alternatives for the proposed anesthesia with the patient or authorized representative who has indicated his/her understanding and acceptance.  ? ? ? ?Dental advisory given ? ?Plan Discussed with: CRNA and Anesthesiologist ? ?Anesthesia Plan Comments:   ? ? ? ? ? ? ?Anesthesia Quick Evaluation ? ?

## 2021-06-09 NOTE — Anesthesia Procedure Notes (Signed)
Procedure Name: Intubation ?Date/Time: 06/09/2021 10:24 AM ?Performed by: Rosaland Lao, CRNA ?Pre-anesthesia Checklist: Patient identified, Emergency Drugs available, Suction available and Patient being monitored ?Patient Re-evaluated:Patient Re-evaluated prior to induction ?Oxygen Delivery Method: Circle system utilized ?Preoxygenation: Pre-oxygenation with 100% oxygen ?Induction Type: IV induction ?Ventilation: Mask ventilation without difficulty ?Laryngoscope Size: Sabra Heck and 2 ?Grade View: Grade I ?Tube type: Oral ?Tube size: 7.0 mm ?Number of attempts: 1 ?Airway Equipment and Method: Stylet and Oral airway ?Placement Confirmation: ETT inserted through vocal cords under direct vision, positive ETCO2 and breath sounds checked- equal and bilateral ?Secured at: 22 cm ?Tube secured with: Tape ?Dental Injury: Teeth and Oropharynx as per pre-operative assessment  ? ? ? ? ?

## 2021-06-10 ENCOUNTER — Encounter (HOSPITAL_COMMUNITY): Payer: Self-pay | Admitting: General Surgery

## 2021-06-11 LAB — SURGICAL PATHOLOGY

## 2021-06-22 ENCOUNTER — Encounter: Payer: Self-pay | Admitting: Student

## 2021-06-29 MED ORDER — LEVOTHYROXINE SODIUM 112 MCG PO TABS: 112.0000 ug | ORAL_TABLET | Freq: Every day | ORAL | 0 refills | Status: DC

## 2021-07-28 ENCOUNTER — Encounter: Payer: Self-pay | Admitting: Student

## 2021-08-04 ENCOUNTER — Encounter: Payer: Self-pay | Admitting: Student

## 2021-08-05 ENCOUNTER — Ambulatory Visit (INDEPENDENT_AMBULATORY_CARE_PROVIDER_SITE_OTHER): Payer: Medicaid Other | Admitting: Student

## 2021-08-05 ENCOUNTER — Other Ambulatory Visit: Payer: Self-pay

## 2021-08-05 VITALS — BP 127/91 | HR 98 | Wt 242.0 lb

## 2021-08-05 DIAGNOSIS — Z7189 Other specified counseling: Secondary | ICD-10-CM

## 2021-08-05 NOTE — Progress Notes (Signed)
Patient reports pain in both breasts but more so right breast. She describes the pain as "a ice pick stabbing me in the breast"  The pain will start from nipple a radiate deep into breast tissue and started roughly last weekend.  Patient is exclusively breast feeding

## 2021-08-06 NOTE — Progress Notes (Signed)
Patient ID: Melissa Whitney, female   DOB: 1980-06-09, 41 y.o.   MRN: [id number]  History:  Melissa Whitney is a 41 y.o. G1P1001 who presents to clinic today for concern for milk bleb on nipple and pain in right breast. She has been exclusivley breast feeding but has noticed deeper pain over the past week; reports a tender mass on right breast that improved with ibuprofen overnight. She denies fever, chills, other complaints. She has decreased feeding on right breast due to pain, thinks that perhaps the decrease in feeding on right side is what has caused this problem. She spoke to a contact at [facility] who suggeseted it could be yeast but patient does not think its yeast as skin is normal appearing and infant does not have any signs of yeast.   The following portions of the patient's history were reviewed and updated as appropriate: allergies, current medications, family history, past medical history, social history, past surgical history and problem list.  Review of Systems:  Review of Systems  Constitutional: Negative.   HENT: Negative.    Respiratory: Negative.    Cardiovascular: Negative.   Genitourinary: Negative.   Musculoskeletal: Negative.   Skin: Negative.   Neurological: Negative.   Psychiatric/Behavioral: Negative.        Objective:  Physical Exam BP (!) 127/91   Pulse 98   Wt 242 lb (109.8 kg)   Breastfeeding Yes   BMI 37.34 kg/m  Physical Exam Constitutional:      Appearance: Normal appearance.  HENT:     Head: Normocephalic.  Abdominal:     General: Abdomen is flat.  Skin:    General: Skin is warm.  Neurological:     General: No focal deficit present.     Mental Status: She is alert.  Psychiatric:        Mood and Affect: Mood normal.        Behavior: Behavior normal.   Overall impression: breasts are symmetrical, pendulus, soft.  Left breast has no lesions, masses or tenderness, full milk glands palpated in left breast. Nipple is pink, evert, warm and dry  with no redness, edema, or fluid draining.   Right breast: pendulous and soft small milk blebon areala, size of a pin-head, at [place] there is a tender, fluctuate sore lump in breast, slight reddening of skin. Nipple is pink, everted, warm and dry.     Labs and Imaging No results found for this or any previous visit (from the past 24 hour(s)).  No results found.  Health Maintenance Due  Topic Date Due  . Hepatitis C Screening  Never done  . PAP SMEAR-Modifier  09/30/2021    Labs, imaging and previous visits in Epic and Care Everywhere reviewed  [place]:    Approximately  minutes of total time was spent with this patient on ***  Starr Lake, CNM 08/06/2021 7:07 AM

## 2021-09-29 ENCOUNTER — Encounter: Payer: Self-pay | Admitting: Gastroenterology

## 2021-09-29 ENCOUNTER — Ambulatory Visit (INDEPENDENT_AMBULATORY_CARE_PROVIDER_SITE_OTHER): Payer: Medicaid Other | Admitting: Gastroenterology

## 2021-09-29 VITALS — BP 118/80 | HR 80 | Ht 67.0 in | Wt 246.2 lb

## 2021-09-29 DIAGNOSIS — R143 Flatulence: Secondary | ICD-10-CM

## 2021-09-29 DIAGNOSIS — R933 Abnormal findings on diagnostic imaging of other parts of digestive tract: Secondary | ICD-10-CM | POA: Diagnosis not present

## 2021-09-29 DIAGNOSIS — R140 Abdominal distension (gaseous): Secondary | ICD-10-CM | POA: Diagnosis not present

## 2021-09-29 NOTE — Progress Notes (Signed)
Chief Complaint: Abnormal imaging study, abdominal bloating   Referring Provider:     Reynold Bowen, MD    HPI:     Melissa Whitney is a 41 y.o. female with history of hypothyroidism, bipolar, PCOS, cholelithiasis  s/p ccy 06/2021, presenting to the Gastroenterology Clinic for evaluation of abdominal bloating and excessive gas.  Gave birth to her daughter on 04/16/2021.  Pregnancy c/b gestational HTN and gestational GERD.  - 05/23/2021: ER evaluation for biliary colic.  WBC 12, otherwise normal CBC, CMP, lipase - 05/23/2021: CT A/P: Cholelithiasis, small splenic cysts vs splenic hemangiomas.  Normal GI tract, liver, pancreas - 05/24/2021: RUQ [place]: Cholelithiasis - 06/09/2021: Underwent uncomplicated laparoscopic cholecystectomy for chronic cholecystitis and cholelithiasis.  Today, her main issue is abdominal bloating and foul-smelling gas. Sxs have actually resolved since scheduling appt. Will have sxs from time-to-time now, but not nearly as pronounced. Trialed BRAT diet with benefit. Has since reintroduced back to baseline diet.   Of note, did have 2 separate antibiotic exposures prior to symptoms onset, to include bacterial vaginosis and perioperative antibiotics for cholecystectomy.   She would also like to discuss the incidentally noted splenic cysts vs splenic hemangiomas on recent CT. No prior hx of splenic or hepatic issues or hematologic issues.  No abdominal pain.  Sister with Psoriatic Arthritis. No known Fhx of IBD, GI malignancy.     Past Medical History:  Diagnosis Date   Depression    Dysmenorrhea    Gallstone    GERD (gastroesophageal reflux disease)    with pregnancy   History of migraine    Hypothyroidism    Mood disorder ([place])    Neurologic cardiac syncope    no issues since age 24   PCOS (polycystic ovarian syndrome)    Pregnancy induced hypertension    Smoker 10/01/2018   Quit 06/18/2019   Toxic maculopathy    Vaginal Pap smear, abnormal       Past Surgical History:  Procedure Laterality Date   APPENDECTOMY     age 70   CHOLECYSTECTOMY N/A 06/09/2021   Procedure: LAPAROSCOPIC CHOLECYSTECTOMY;  Surgeon: Ralene Ok, MD;  Location: [facility];  Service: General;  Laterality: N/A;   COLPOSCOPY     GYNECOLOGIC CRYOSURGERY     cervical   TONSILLECTOMY AND ADENOIDECTOMY     WISDOM TOOTH EXTRACTION     Family History  Problem Relation Age of Onset   Thyroid disease Mother    Endometriosis Mother    COPD Mother    Heart disease Father    Heart attack Father    Arthritis Sister        psoriatic   Thyroid disease Sister    Endometriosis Sister    Thyroid disease Maternal Grandmother    Heart disease Maternal Grandmother    Esophageal cancer Paternal Grandmother    Social History   Tobacco Use   Smoking status: Former    Packs/day: 1.00    Types: Cigarettes    Quit date: 06/2019    Years since quitting: 2.3   Smokeless tobacco: Never  Vaping Use   Vaping Use: Never used  Substance Use Topics   Alcohol use: Not Currently    Comment: 3 years   Drug use: Not Currently   Current Outpatient Medications  Medication Sig Dispense Refill   ibuprofen (ADVIL) 200 MG tablet Take 400 mg by mouth every 6 (six) hours as needed for headache.  levothyroxine (SYNTHROID) 112 MCG tablet Take 1 tablet (112 mcg total) by mouth daily before breakfast. 90 tablet 0   sertraline (ZOLOFT) 50 MG tablet Take 1 tablet (50 mg total) by mouth daily. 30 tablet 6   No current facility-administered medications for this visit.   Allergies  Allergen Reactions   Other Itching and Swelling    Poppy Seed     Review of Systems: All systems reviewed and negative except where noted in HPI.     Physical Exam:    Wt Readings from Last 3 Encounters:  09/29/21 246 lb 4 oz (111.7 kg)  08/05/21 242 lb (109.8 kg)  06/04/21 229 lb (103.9 kg)    BP 118/80 (BP Location: Left Arm, Patient Position: Sitting, Cuff Size: Large)   Pulse 80    Ht '5\' 7"'$  (1.702 m) Comment: height measured without shoes  Wt 246 lb 4 oz (111.7 kg)   Breastfeeding Yes   BMI 38.57 kg/m  Constitutional:  Pleasant, in no acute distress. Psychiatric: Normal mood and affect. Behavior is normal. Cardiovascular: Normal rate, regular rhythm. No edema Pulmonary/chest: Effort normal and breath sounds normal. No wheezing, rales or rhonchi. Abdominal: Soft, nondistended, nontender. Bowel sounds active throughout. There are no masses palpable. No hepatomegaly. Neurological: Alert and oriented to person place and time. Skin: Skin is warm and dry. No rashes noted.   ASSESSMENT AND PLAN;   1) Abdominal bloating 2) Excessive gas Suspect related to recent antibiotics exposure x2 vs mild infectious enteritis.  In either case, symptoms much improved and back to baseline diet. - Reasonable to trial course of probiotics for the next 4 weeks - Continue advancing diet as tolerated.  Reassurance provided - If symptoms recur or other concerning symptoms, to recall clinic  3) Splenic cysts vs hemangioma - Provided reassurance re: benign incidental findings.  No additional imaging needed   RTC prn    Lavena Bullion, DO, FACG  09/29/2021, 2:25 PM   Reynold Bowen, MD

## 2021-09-29 NOTE — Patient Instructions (Addendum)
If you are age 39 or younger, your body mass index should be between 19-25. Your Body mass index is 38.57 kg/m. If this is out of the aformentioned range listed, please consider follow up with your Primary Care Provider.   ________________________________________________________  The [facility] providers would like to encourage you to use [facility] to communicate with providers for non-urgent requests or questions.  Due to long hold times on the telephone, sending your provider a message by [facility] may be a faster and more efficient way to get a response.  Please allow 48 business hours for a response.  Please remember that this is for non-urgent requests.  _______________________________________________________  Due to recent changes in healthcare laws, you may see the results of your imaging and laboratory studies on MyChart before your provider has had a chance to review them.  We understand that in some cases there may be results that are confusing or concerning to you. Not all laboratory results come back in the same time frame and the provider may be waiting for multiple results in order to interpret others.  Please give [place] 48 hours in order for your provider to thoroughly review all the results before contacting the office for clarification of your results.    Start taking probiotic.  Follow up as needed.  Thank you for choosing me and [facility].  Vito Cirigliano, D.O.

## 2021-11-03 ENCOUNTER — Other Ambulatory Visit: Payer: Self-pay | Admitting: Student

## 2021-11-15 ENCOUNTER — Ambulatory Visit (INDEPENDENT_AMBULATORY_CARE_PROVIDER_SITE_OTHER): Payer: Medicaid Other | Admitting: Nurse Practitioner

## 2021-11-15 ENCOUNTER — Encounter: Payer: Self-pay | Admitting: Nurse Practitioner

## 2021-11-15 VITALS — BP 130/80 | HR 90 | Temp 98.4°F | Ht 67.0 in | Wt 249.6 lb

## 2021-11-15 DIAGNOSIS — F3181 Bipolar II disorder: Secondary | ICD-10-CM

## 2021-11-15 DIAGNOSIS — Z79899 Other long term (current) drug therapy: Secondary | ICD-10-CM

## 2021-11-15 DIAGNOSIS — E6609 Other obesity due to excess calories: Secondary | ICD-10-CM

## 2021-11-15 DIAGNOSIS — Z1159 Encounter for screening for other viral diseases: Secondary | ICD-10-CM

## 2021-11-15 DIAGNOSIS — L680 Hirsutism: Secondary | ICD-10-CM

## 2021-11-15 DIAGNOSIS — Z832 Family history of diseases of the blood and blood-forming organs and certain disorders involving the immune mechanism: Secondary | ICD-10-CM

## 2021-11-15 DIAGNOSIS — F3341 Major depressive disorder, recurrent, in partial remission: Secondary | ICD-10-CM

## 2021-11-15 DIAGNOSIS — R632 Polyphagia: Secondary | ICD-10-CM | POA: Diagnosis not present

## 2021-11-15 DIAGNOSIS — Z23 Encounter for immunization: Secondary | ICD-10-CM | POA: Diagnosis not present

## 2021-11-15 DIAGNOSIS — M79671 Pain in right foot: Secondary | ICD-10-CM

## 2021-11-15 DIAGNOSIS — E039 Hypothyroidism, unspecified: Secondary | ICD-10-CM | POA: Diagnosis not present

## 2021-11-15 DIAGNOSIS — Z7689 Persons encountering health services in other specified circumstances: Secondary | ICD-10-CM

## 2021-11-15 DIAGNOSIS — Z6839 Body mass index (BMI) 39.0-39.9, adult: Secondary | ICD-10-CM

## 2021-11-15 MED ORDER — CEPHALEXIN 500 MG PO CAPS: 500.0000 mg | ORAL_CAPSULE | Freq: Four times a day (QID) | ORAL | 0 refills | Status: AC

## 2021-11-15 NOTE — Progress Notes (Signed)
I,Tianna Badgett,acting as a Education administrator for [employer], FNP.,have documented all relevant documentation on the behalf of Minette Brine, FNP,as directed by  Minette Brine, FNP while in the presence of Minette Brine, [place].  Subjective:     Patient ID: Melissa Whitney , female    DOB: 12/26/1980 , 41 y.o.   MRN: [id number]   Chief Complaint  Patient presents with   New Patient (Initial Visit)    HPI  Patient presents today to establish care. She was seeing Dr. Roque Cash prior to coming here. 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 Married. She has one daughter 7 months. She is currently breast feeding. She had not seen him in at least one year. She has gained an increased amount of weight and hungry often.   GYN with [facility]  Sister - psoriatic arthritis, prediabetes Father - inflammatory problem, Type 2 diabetes  Gallbladder removed in May. She seen GI for spots on her spleen GI felt was not concerning.   She has taken Seroquel 25 mg for bipolar disorder (was seeing a provider virtual) but stopped when she got pregnant. She was also being treated for ADHD with Vyvanse for 2 years (2018-2020). She stopped due to not sleeping. She was referred to psychiatry by her therapist no longer sees them. She had encapsulated her placenta and did not have any postpartum depression and feels she is doing well.      Past Medical History:  Diagnosis Date   Depression    Dysmenorrhea    Gallstone    GERD (gastroesophageal reflux disease)    with pregnancy   History of migraine    Hypothyroidism    Mood disorder ([place])    Neurologic cardiac syncope    no issues since age 66   PCOS (polycystic ovarian syndrome)    Pregnancy induced hypertension    Smoker 10/01/2018   Quit 06/18/2019   Toxic maculopathy    Vaginal Pap smear, abnormal      Family History  Problem Relation Age of Onset   Thyroid disease Mother    Endometriosis Mother    COPD Mother     Heart disease Father    Heart attack Father    Arthritis Sister        psoriatic   Thyroid disease Sister    Endometriosis Sister    Thyroid disease Maternal Grandmother    Heart disease Maternal Grandmother    Esophageal cancer Paternal Grandmother      Current Outpatient Medications:    cephALEXin (KEFLEX) 500 MG capsule, Take 1 capsule (500 mg total) by mouth 4 (four) times daily for 10 days., Disp: 40 capsule, Rfl: 0   levothyroxine (SYNTHROID) 112 MCG tablet, Take 1 tablet (112 mcg total) by mouth daily before breakfast., Disp: 90 tablet, Rfl: 0   sertraline (ZOLOFT) 50 MG tablet, Take 1 tablet (50 mg total) by mouth daily., Disp: 30 tablet, Rfl: 6   Allergies  Allergen Reactions   Other Itching and Swelling    Poppy Seed     Review of Systems  Constitutional:  Positive for unexpected weight change (weighs more than when pregnant).  Respiratory: Negative.    Cardiovascular: Negative.   Gastrointestinal: Negative.   Endocrine: Positive for polyphagia. Negative for polydipsia and polyuria.       She is breast feeding. She tries to eat fruits and vegetables.   Musculoskeletal:  Positive for gait problem.  Right foot pain bottom for about 2 months - thought was a splinter, and describes as being hot touch. Unable to walk on ball of foot.    Psychiatric/Behavioral: Negative.       Today's Vitals   11/15/21 1513  BP: 130/80  Pulse: 90  Temp: 98.4 F (36.9 C)  TempSrc: Oral  Weight: 249 lb 9.6 oz (113.2 kg)  Height: $Remove'5\' 7"'MJhZpas$  (1.702 m)   Body mass index is 39.09 kg/m.   Objective:  Physical Exam Vitals reviewed.  Constitutional:      Appearance: Normal appearance. She is well-developed.  HENT:     Head: Normocephalic and atraumatic.  Eyes:     Pupils: Pupils are equal, round, and reactive to light.  Cardiovascular:     Rate and Rhythm: Normal rate and regular rhythm.     Pulses: Normal pulses.     Heart sounds: Normal heart sounds. No murmur  heard. Pulmonary:     Effort: Pulmonary effort is normal.     Breath sounds: Normal breath sounds.  Musculoskeletal:        General: Normal range of motion.  Skin:    General: Skin is warm and dry.     Capillary Refill: Capillary refill takes less than 2 seconds.  Neurological:     General: No focal deficit present.     Mental Status: She is alert and oriented to person, place, and time.     Cranial Nerves: No cranial nerve deficit.  Psychiatric:        Mood and Affect: Mood normal.         Assessment And Plan:     1. Acquired hypothyroidism Will check levels and make changes to medications pending results - TSH + free T4  2. Bipolar II disorder ([place]) No current medications.   3. Recurrent major depressive disorder, in partial remission ([place])  4. Polyphagia - Hemoglobin A1c - CMP14+EGFR  5. Hirsutism Comments: Will check for metabolic cause.   6. Right foot pain Comments: she has a darkened area to her plantar surface. Will refer to podiatry. Will treat with an antibiotic due to pain. May have a splinter - Ambulatory referral to Podiatry - cephALEXin (KEFLEX) 500 MG capsule; Take 1 capsule (500 mg total) by mouth 4 (four) times daily for 10 days.  Dispense: 40 capsule; Refill: 0  7. 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 Family history of autoimmune disorder - Sed Rate (ESR)  9. Need for influenza vaccination Influenza vaccine administered Encouraged to take Tylenol as needed for fever or muscle aches. - Flu Vaccine QUAD 6+ mos PF IM (Fluarix Quad PF)  10. Other long term (current) drug therapy - CBC  11. Encounter for hepatitis C screening test for low risk patient Will check Hepatitis C screening due to recent recommendations to  screen all adults 18 years and older - Hepatitis C antibody  12. Establishing care with new doctor, encounter for     Patient was given opportunity to ask questions. Patient verbalized understanding of the plan and was able to repeat key elements of the plan. All questions were answered to their satisfaction.  Minette Brine, FNP   I, Minette Brine, FNP, have reviewed all documentation for this visit. The documentation on 11/24/21 for  the exam, diagnosis, procedures, and orders are all accurate and complete.   IF YOU HAVE BEEN REFERRED TO A SPECIALIST, IT MAY TAKE 1-2 WEEKS TO SCHEDULE/PROCESS THE REFERRAL. IF YOU HAVE NOT HEARD FROM US/SPECIALIST IN TWO WEEKS, PLEASE GIVE [place] A CALL AT [phone].   THE PATIENT IS ENCOURAGED TO PRACTICE SOCIAL DISTANCING DUE TO THE COVID-19 PANDEMIC.

## 2021-11-15 NOTE — Patient Instructions (Signed)
Thyroid-Stimulating Hormone Test Why am I having this test? The thyroid is a gland in the lower front of the neck. It makes hormones that affect many body parts and systems, including the system that affects how quickly the body burns fuel for energy (metabolism). The pituitary gland is located just below the brain, behind the eyes and nasal passages. It helps maintain thyroid hormone levels and thyroid gland function. You may have a thyroid-stimulating hormone (TSH) test if you have possible symptoms of abnormal thyroid hormone levels. This test can help your health care provider: Diagnose a disorder of the thyroid gland or pituitary gland. Manage your condition and treatment if you have an underactive thyroid (hypothyroidism) or an overactive thyroid (hyperthyroidism). Newborn babies may have this test done to screen for hypothyroidism that is present at birth (congenital). What is being tested? This test measures the amount of TSH in your blood. TSH may also be called thyrotropin. When the thyroid does not make enough hormones, the pituitary gland releases TSH into the bloodstream to stimulate the thyroid gland to make more hormones. What kind of sample is taken?     A blood sample is required for this test. It is usually collected by inserting a needle into a blood vessel. For newborns, a small amount of blood may be collected from the umbilical cord, or by using a small needle to prick the baby's heel (heel stick). Tell a health care provider about: All medicines you are taking, including vitamins, herbs, eye drops, creams, and over-the-counter medicines. Any bleeding problems you have. Any surgeries you have had. Any medical conditions you have. Whether you are pregnant or may be pregnant. How are the results reported? Your test results will be reported as a value that indicates how much TSH is in your blood. Your health care provider will compare your results to normal ranges that were  established after testing a large group of people (reference ranges). Reference ranges may vary among labs and hospitals. For this test, common reference ranges are: Adult: 2-10 microunits/mL or 2-10 milliunits/L. Newborn: Heel stick: 3-18 microunits/mL or 3-18 milliunits/L. Umbilical cord: 3-12 microunits/mL or 3-12 milliunits/L. What do the results mean? Results that are within the reference range are considered normal. This means that you have a normal amount of TSH in your blood. Results that are higher than the reference range mean that your TSH levels are too high. This may mean: Your thyroid gland is not making enough thyroid hormones. Your thyroid medicine dosage is too low. You have a tumor on your pituitary gland. This is rare. Results that are lower than the reference range mean that your TSH levels are too low. This may be caused by hyperthyroidism or by a problem with the pituitary gland function. Talk with your health care provider about what your results mean. Questions to ask your health care provider Ask your health care provider, or the department that is doing the test: When will my results be ready? How will I get my results? What are my treatment options? What other tests do I need? What are my next steps? Summary You may have a thyroid-stimulating hormone (TSH) test if you have possible symptoms of abnormal thyroid hormone levels. The thyroid is a gland in the lower front of the neck. It makes hormones that affect many body parts and systems. The pituitary gland is located just below the brain, behind the eyes and nasal passages. It helps maintain thyroid hormone levels and thyroid gland function. This test   measures the amount of TSH in your blood. TSH is made by the pituitary gland. It may also be called thyrotropin. This information is not intended to replace advice given to you by your health care provider. Make sure you discuss any questions you have with your  health care provider. Document Revised: 01/26/2021 Document Reviewed: 01/26/2021 Elsevier Patient Education  2023 [employer] Inc.  

## 2021-11-16 LAB — HEMOGLOBIN A1C
Est. average glucose Bld gHb Est-mCnc: 111 mg/dL
Hgb A1c MFr Bld: 5.5 % (ref 4.8–5.6)

## 2021-11-16 LAB — CBC
Hematocrit: 45.3 % (ref 34.0–46.6)
Hemoglobin: 14.7 g/dL (ref 11.1–15.9)
MCH: 26.4 pg — ABNORMAL LOW (ref 26.6–33.0)
MCHC: 32.5 g/dL (ref 31.5–35.7)
MCV: 82 fL (ref 79–97)
Platelets: 359 10*3/uL (ref 150–450)
RBC: 5.56 x10E6/uL — ABNORMAL HIGH (ref 3.77–5.28)
RDW: 12.5 % (ref 11.7–15.4)
WBC: 8.4 10*3/uL (ref 3.4–10.8)

## 2021-11-16 LAB — TSH+FREE T4
Free T4: 0.6 ng/dL — ABNORMAL LOW (ref 0.82–1.77)
TSH: 42.4 u[IU]/mL — ABNORMAL HIGH (ref 0.450–4.500)

## 2021-11-16 LAB — CMP14+EGFR
ALT: 28 IU/L (ref 0–32)
AST: 23 IU/L (ref 0–40)
Albumin/Globulin Ratio: 1.9 (ref 1.2–2.2)
Albumin: 4.9 g/dL (ref 3.9–4.9)
Alkaline Phosphatase: 128 IU/L — ABNORMAL HIGH (ref 44–121)
BUN/Creatinine Ratio: 12 (ref 9–23)
BUN: 12 mg/dL (ref 6–20)
Bilirubin Total: 0.3 mg/dL (ref 0.0–1.2)
CO2: 25 mmol/L (ref 20–29)
Calcium: 10 mg/dL (ref 8.7–10.2)
Chloride: 103 mmol/L (ref 96–106)
Creatinine, Ser: 0.99 mg/dL (ref 0.57–1.00)
Globulin, Total: 2.6 g/dL (ref 1.5–4.5)
Glucose: 78 mg/dL (ref 70–99)
Potassium: 4.1 mmol/L (ref 3.5–5.2)
Sodium: 142 mmol/L (ref 134–144)
Total Protein: 7.5 g/dL (ref 6.0–8.5)
eGFR: 75 mL/min/{1.73_m2} (ref >59)

## 2021-11-16 LAB — SEDIMENTATION RATE: Sed Rate: 3 mm/hr (ref 0–32)

## 2021-11-16 LAB — HEPATITIS C ANTIBODY: Hep C Virus Ab: NONREACTIVE

## 2021-11-17 ENCOUNTER — Encounter: Payer: Self-pay | Admitting: Nurse Practitioner

## 2021-11-18 ENCOUNTER — Other Ambulatory Visit: Payer: Self-pay

## 2021-11-18 DIAGNOSIS — F3181 Bipolar II disorder: Secondary | ICD-10-CM

## 2021-11-18 MED ORDER — SERTRALINE HCL 50 MG PO TABS: 50.0000 mg | ORAL_TABLET | Freq: Every day | ORAL | 6 refills | Status: DC

## 2021-11-18 MED ORDER — LEVOTHYROXINE SODIUM 112 MCG PO TABS: 112.0000 ug | ORAL_TABLET | Freq: Every day | ORAL | 0 refills | Status: DC

## 2021-11-26 ENCOUNTER — Encounter (HOSPITAL_COMMUNITY): Payer: Self-pay

## 2021-11-26 ENCOUNTER — Ambulatory Visit (HOSPITAL_COMMUNITY)
Admission: RE | Admit: 2021-11-26 | Discharge: 2021-11-26 | Disposition: A | Discharge status: Home / Self Care | Payer: Medicaid Other | Source: Ambulatory Visit | Attending: Family Medicine | Admitting: Family Medicine

## 2021-11-26 VITALS — BP 144/95 | HR 68 | Temp 97.9°F | Resp 16

## 2021-11-26 DIAGNOSIS — J029 Acute pharyngitis, unspecified: Secondary | ICD-10-CM | POA: Insufficient documentation

## 2021-11-26 DIAGNOSIS — Z1152 Encounter for screening for COVID-19: Secondary | ICD-10-CM | POA: Diagnosis not present

## 2021-11-26 LAB — POCT RAPID STREP A, ED / UC: Streptococcus, Group A Screen (Direct): NEGATIVE

## 2021-11-26 NOTE — ED Triage Notes (Signed)
Pt is here for headache, sore throat , body aches and chills since today

## 2021-11-26 NOTE — ED Provider Notes (Signed)
[place]    CSN: [id number] Arrival date & time: 11/26/21  1302      History   Chief Complaint Chief Complaint  Patient presents with   Sore Throat    Headache, body aches, sore throat. Would like a strep test and covid test please - Entered by patient   Headache    HPI IRAN KIEVIT is a 41 y.o. female.   Patient is here for sore throat, headache, chills body aches that started today.  She feels feverish, but no known fever.  No runny nose or congestion.  Her sister had strep about 10 days ago.  She is not sure if she was around her during that time or not.        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   Bipolar II disorder ([place]) 03/04/2021   Hypothyroid 10/01/2018    Past Surgical History:  Procedure Laterality Date   APPENDECTOMY     age 64   CHOLECYSTECTOMY N/A 06/09/2021   Procedure: LAPAROSCOPIC CHOLECYSTECTOMY;  Surgeon: Ralene Ok, MD;  Location: [facility];  Service: General;  Laterality: N/A;   COLPOSCOPY     GYNECOLOGIC CRYOSURGERY     cervical   TONSILLECTOMY AND ADENOIDECTOMY     WISDOM TOOTH EXTRACTION      OB History     Gravida  1   Para  1   Term  1   Preterm  0   AB  0   Living  1      SAB  0   IAB  0   Ectopic  0   Multiple  0   Live Births  1            Home Medications    Prior to Admission medications   Medication Sig Start Date End Date Taking? Authorizing Provider   levothyroxine (SYNTHROID) 112 MCG tablet Take 1 tablet (112 mcg total) by mouth daily before breakfast. 11/18/21   Minette Brine, FNP  sertraline (ZOLOFT) 50 MG tablet Take 1 tablet (50 mg total) by mouth daily. 11/18/21   Minette Brine, FNP    Family History Family History  Problem Relation Age of Onset   Thyroid disease Mother    Endometriosis Mother    COPD Mother    Heart disease Father    Heart attack Father    Arthritis Sister        psoriatic   Thyroid disease Sister    Endometriosis Sister    Thyroid disease Maternal Grandmother    Heart disease Maternal Grandmother    Esophageal cancer Paternal Grandmother     Social History Social History   Tobacco Use   Smoking status: Former    Packs/day: 1.00    Types: Cigarettes    Quit date: 06/2019    Years since quitting: 2.4   Smokeless tobacco: Never  Vaping Use   Vaping Use: Never used  Substance Use Topics   Alcohol use: Not Currently    Comment: 3 years   Drug use: Not Currently     Allergies   Other   Review of Systems Review of Systems  Constitutional:  Positive for chills.  HENT:  Positive for sore throat. Negative for rhinorrhea.   Respiratory: Negative.    Cardiovascular: Negative.   Gastrointestinal: Negative.   Genitourinary: Negative.   Musculoskeletal:  Positive for myalgias.  Neurological:  Positive for headaches.     Physical Exam Triage Vital Signs ED Triage Vitals  Enc Vitals Group     BP 11/26/21 1314 (!) 144/95     Pulse Rate 11/26/21 1314 68     Resp 11/26/21 1314 16     Temp 11/26/21 1314 97.9 F (36.6 C)     Temp Source 11/26/21 1314 Oral     SpO2 11/26/21 1314 100 %     Weight --      Height --      Head Circumference --      Peak Flow --      Pain Score 11/26/21 1313 0     Pain Loc --      Pain Edu? --      Excl. in [place]? --    No data found.  Updated Vital Signs BP (!) 144/95 (BP Location: Left Wrist)   Pulse 68   Temp 97.9 F (36.6 C) (Oral)   Resp 16    SpO2 100%   Breastfeeding Yes   Visual Acuity Right Eye Distance:   Left Eye Distance:   Bilateral Distance:    Right Eye Near:   Left Eye Near:    Bilateral Near:     Physical Exam Constitutional:      Appearance: She is well-developed.  HENT:     Head: Normocephalic and atraumatic.     Nose: No congestion or rhinorrhea.     Mouth/Throat:     Mouth: Mucous membranes are moist.     Pharynx: Posterior oropharyngeal erythema present. No pharyngeal swelling or oropharyngeal exudate.     Tonsils: No tonsillar exudate.  Cardiovascular:     Rate and Rhythm: Normal rate and regular rhythm.     Heart sounds: Normal heart sounds.  Pulmonary:     Effort: Pulmonary effort is normal.  Musculoskeletal:     Cervical back: Normal range of motion and neck supple.  Lymphadenopathy:     Cervical: No cervical adenopathy.  Skin:    General: Skin is warm.  Neurological:     General: No focal deficit present.     Mental Status: She is alert.  Psychiatric:        Mood and Affect: Mood normal.      UC Treatments / Results  Labs (all labs ordered are listed, but only abnormal results are displayed) Labs Reviewed  CULTURE, GROUP A STREP ([place])  SARS CORONAVIRUS 2 (TAT 6-24 HRS)  POCT RAPID STREP A, ED / UC    EKG   Radiology No results found.  Procedures Procedures (including critical care time)  Medications Ordered in UC Medications - No data to display  Initial Impression / Assessment and Plan / UC Course  I have reviewed the triage vital signs and the nursing notes.  Pertinent labs & imaging results that were available during my care of the patient were reviewed by me and considered in my medical decision making (see chart for details).  Final Clinical Impressions(s) / UC Diagnoses   Final diagnoses:  Sore throat  Encounter for screening for COVID-19     Discharge Instructions      You were seen today for sore throat.  Your rapid strep was negative but this  will be sent for culture.  If positive we will call to treat with an antibiotic.  In the mean time we have swabbed you for covid and this will be resulted by tomorrow.  You will be notified if positive.  In the mean time you can take tylenol or motrin for pain, as well as salt water gargles.     ED Prescriptions   None    PDMP not reviewed this encounter.   Rondel Oh, MD 11/26/21 1336

## 2021-11-26 NOTE — Discharge Instructions (Signed)
You were seen today for sore throat.  Your rapid strep was negative but this will be sent for culture.  If positive we will call to treat with an antibiotic.  In the mean time we have swabbed you for covid and this will be resulted by tomorrow.  You will be notified if positive.  In the mean time you can take tylenol or motrin for pain, as well as salt water gargles.

## 2021-11-27 LAB — SARS CORONAVIRUS 2 (TAT 6-24 HRS): SARS Coronavirus 2: NEGATIVE

## 2021-11-29 LAB — CULTURE, GROUP A STREP (THRC)

## 2021-12-03 ENCOUNTER — Ambulatory Visit (INDEPENDENT_AMBULATORY_CARE_PROVIDER_SITE_OTHER): Payer: Medicaid Other

## 2021-12-03 ENCOUNTER — Encounter: Payer: Self-pay | Admitting: Podiatry

## 2021-12-03 ENCOUNTER — Ambulatory Visit (INDEPENDENT_AMBULATORY_CARE_PROVIDER_SITE_OTHER): Payer: Medicaid Other | Admitting: Podiatry

## 2021-12-03 DIAGNOSIS — S90851A Superficial foreign body, right foot, initial encounter: Secondary | ICD-10-CM

## 2021-12-03 DIAGNOSIS — D492 Neoplasm of unspecified behavior of bone, soft tissue, and skin: Secondary | ICD-10-CM

## 2021-12-04 NOTE — Progress Notes (Signed)
Subjective:   Patient ID: Melissa Whitney, female   DOB: 41 y.o.   MRN: [id number]   HPI Patient presents stating she has a lesion on her right foot that she is not sure what it is.  Been present several months she thinks may be a splinter but she is not sure but does not remember stepping on anything.  Patient just has had a baby does not smoke likes to be active   Review of Systems  All other systems reviewed and are negative.       Objective:  Physical Exam Vitals and nursing note reviewed.  Constitutional:      Appearance: She is well-developed.  Pulmonary:     Effort: Pulmonary effort is normal.  Musculoskeletal:        General: Normal range of motion.  Skin:    General: Skin is warm.  Neurological:     Mental Status: She is alert.     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  Reviewed x-ray and at this point deep debridement of lesion accomplished applied chemical agent to the benign neoplasm to create chemical reaction and immune response applied sterile dressing explained what to do with blistering were to occur reappoint as needed  X-rays indicate no signs of a splinter or foreign body in the area

## 2021-12-05 ENCOUNTER — Encounter: Payer: Self-pay | Admitting: Nurse Practitioner

## 2021-12-09 ENCOUNTER — Other Ambulatory Visit: Payer: Self-pay | Admitting: Nurse Practitioner

## 2021-12-09 DIAGNOSIS — F3181 Bipolar II disorder: Secondary | ICD-10-CM

## 2021-12-09 MED ORDER — SERTRALINE HCL 100 MG PO TABS: 100.0000 mg | ORAL_TABLET | Freq: Every day | ORAL | 2 refills | Status: DC

## 2022-01-11 ENCOUNTER — Other Ambulatory Visit: Payer: Medicaid Other

## 2022-01-17 ENCOUNTER — Ambulatory Visit
Admission: RE | Admit: 2022-01-17 | Discharge: 2022-01-17 | Disposition: A | Discharge status: Home / Self Care | Payer: Medicaid Other | Source: Ambulatory Visit

## 2022-01-17 VITALS — BP 138/77 | HR 97 | Temp 98.2°F | Resp 16

## 2022-01-17 DIAGNOSIS — B9789 Other viral agents as the cause of diseases classified elsewhere: Secondary | ICD-10-CM

## 2022-01-17 DIAGNOSIS — J329 Chronic sinusitis, unspecified: Secondary | ICD-10-CM

## 2022-01-17 DIAGNOSIS — J040 Acute laryngitis: Secondary | ICD-10-CM

## 2022-01-17 DIAGNOSIS — J988 Other specified respiratory disorders: Secondary | ICD-10-CM

## 2022-01-17 MED ORDER — IBUPROFEN 400 MG PO TABS: 400.0000 mg | ORAL_TABLET | Freq: Three times a day (TID) | ORAL | 0 refills | Status: DC | PRN

## 2022-01-17 MED ORDER — CETIRIZINE HCL 10 MG PO TABS: 10.0000 mg | ORAL_TABLET | Freq: Every day | ORAL | 1 refills | Status: DC

## 2022-01-17 MED ORDER — FLUTICASONE PROPIONATE 50 MCG/ACT NA SUSP: 1.0000 | Freq: Every day | NASAL | 2 refills | Status: DC

## 2022-01-17 NOTE — ED Provider Notes (Signed)
[facility]    CSN: [id number] Arrival date & time: 01/17/22  1556    HISTORY   Chief Complaint  Patient presents with   Cough    Sinus drainage, painful cough, laryngitis - Entered by patient   Sore Throat        Nasal Congestion   HPI Melissa Whitney is a pleasant, 41 y.o. female who presents to urgent care today. Patient complains of a 1 week history of sore throat, chills, nonproductive cough and sinus pressure.  Patient states yesterday she lost her voice.  Patient states she is been taking Tylenol with some relief.  Patient states she is currently breast-feeding.  Patient has essentially normal vital signs on arrival today, SpO2 is slightly diminished at 95%.  Patient reports a history of seasonal allergies but states not currently taking antihistamine at this time due to breast-feeding.  Patient states she has noticed a little pain in her chest when coughing.  The history is provided by the patient.   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   Bipolar II disorder ([place]) 03/04/2021   Hypothyroid 10/01/2018   Past Surgical History:  Procedure Laterality Date   APPENDECTOMY     age 32   CHOLECYSTECTOMY N/A 06/09/2021   Procedure: LAPAROSCOPIC CHOLECYSTECTOMY;  Surgeon: Ralene Ok, MD;  Location: [facility];  Service: General;  Laterality: N/A;   COLPOSCOPY     GYNECOLOGIC CRYOSURGERY     cervical   TONSILLECTOMY AND ADENOIDECTOMY     WISDOM  TOOTH EXTRACTION     OB History     Gravida  1   Para  1   Term  1   Preterm  0   AB  0   Living  1      SAB  0   IAB  0   Ectopic  0   Multiple  0   Live Births  1          Home Medications    Prior to Admission medications   Medication Sig Start Date End Date Taking? Authorizing Provider  acetaminophen (TYLENOL) 500 MG tablet Take 500 mg by mouth every 6 (six) hours as needed.   Yes [provider]  levothyroxine (SYNTHROID) 112 MCG tablet Take 1 tablet (112 mcg total) by mouth daily before breakfast. 11/18/21   Minette Brine, FNP  sertraline (ZOLOFT) 100 MG tablet Take 1 tablet (100 mg total) by mouth daily. 12/09/21   Minette Brine, FNP    Family History Family History  Problem Relation Age of Onset   Thyroid disease Mother    Endometriosis Mother    COPD Mother    Heart disease Father    Heart attack Father    Arthritis Sister        psoriatic   Thyroid disease Sister    Endometriosis Sister    Thyroid disease  Maternal Grandmother    Heart disease Maternal Grandmother    Esophageal cancer Paternal Grandmother    Social History Social History   Tobacco Use   Smoking status: Former    Packs/day: 1.00    Types: Cigarettes    Quit date: 06/2019    Years since quitting: 2.6   Smokeless tobacco: Never  Vaping Use   Vaping Use: Never used  Substance Use Topics   Alcohol use: Not Currently    Comment: 3 years   Drug use: Not Currently   Allergies   Other  Review of Systems Review of Systems Pertinent findings revealed after performing a 14 point review of systems has been noted in the history of present illness.  Physical Exam Triage Vital Signs ED Triage Vitals  Enc Vitals Group     BP 12/04/20 0827 (!) 147/82     Pulse Rate 12/04/20 0827 72     Resp 12/04/20 0827 18     Temp 12/04/20 0827 98.3 F (36.8 C)     Temp Source 12/04/20 0827 Oral     SpO2 12/04/20 0827 98 %     Weight --      Height --      Head  Circumference --      Peak Flow --      Pain Score 12/04/20 0826 5     Pain Loc --      Pain Edu? --      Excl. in [place]? --   No data found.  Updated Vital Signs BP 138/77 (BP Location: Left Arm)   Pulse 97   Temp 98.2 F (36.8 C) (Oral)   Resp 16   SpO2 95%   Breastfeeding Yes   Physical Exam Vitals and nursing note reviewed.  Constitutional:      General: She is not in acute distress.    Appearance: Normal appearance. She is not ill-appearing.  HENT:     Head: Normocephalic and atraumatic.     Salivary Glands: Right salivary gland is not diffusely enlarged or tender. Left salivary gland is not diffusely enlarged or tender.     Right Ear: Ear canal and external ear normal. No drainage. A middle ear effusion is present. There is no impacted cerumen. Tympanic membrane is bulging. Tympanic membrane is not injected or erythematous.     Left Ear: Ear canal and external ear normal. No drainage. A middle ear effusion is present. There is no impacted cerumen. Tympanic membrane is bulging. Tympanic membrane is not injected or erythematous.     Ears:     Comments: Bilateral EACs normal, both TMs bulging with clear fluid    Nose: Rhinorrhea present. No nasal deformity, septal deviation, signs of injury, nasal tenderness, mucosal edema or congestion. Rhinorrhea is clear.     Right Nostril: Occlusion present. No foreign body, epistaxis or septal hematoma.     Left Nostril: Occlusion present. No foreign body, epistaxis or septal hematoma.     Right Turbinates: Enlarged, swollen and pale.     Left Turbinates: Enlarged, swollen and pale.     Right Sinus: No maxillary sinus tenderness or frontal sinus tenderness.     Left Sinus: No maxillary sinus tenderness or frontal sinus tenderness.     Mouth/Throat:     Lips: Pink. No lesions.     Mouth: Mucous membranes are moist. No oral lesions.     Pharynx: Oropharynx is clear. Uvula midline. No posterior oropharyngeal erythema or uvula swelling.  Tonsils: No tonsillar exudate. 0 on the right. 0 on the left.     Comments: Postnasal drip Eyes:     General: Lids are normal.        Right eye: No discharge.        Left eye: No discharge.     Extraocular Movements: Extraocular movements intact.     Conjunctiva/sclera: Conjunctivae normal.     Right eye: Right conjunctiva is not injected.     Left eye: Left conjunctiva is not injected.  Neck:     Trachea: Trachea and phonation normal.  Cardiovascular:     Rate and Rhythm: Normal rate and regular rhythm.     Pulses: Normal pulses.     Heart sounds: Normal heart sounds. No murmur heard.    No friction rub. No gallop.  Pulmonary:     Effort: Pulmonary effort is normal. No tachypnea, bradypnea, accessory muscle usage, prolonged expiration, respiratory distress or retractions.     Breath sounds: Normal breath sounds. No stridor, decreased air movement or transmitted upper airway sounds. No decreased breath sounds, wheezing, rhonchi or rales.  Chest:     Chest wall: No tenderness.  Musculoskeletal:        General: Normal range of motion.     Cervical back: Full passive range of motion without pain, normal range of motion and neck supple. Normal range of motion.  Lymphadenopathy:     Cervical: No cervical adenopathy.     Right cervical: No superficial, deep or posterior cervical adenopathy.    Left cervical: No superficial, deep or posterior cervical adenopathy.  Skin:    General: Skin is warm and dry.     Findings: No erythema or rash.  Neurological:     General: No focal deficit present.     Mental Status: She is alert and oriented to person, place, and time.  Psychiatric:        Attention and Perception: Attention and perception normal.        Mood and Affect: Mood and affect normal.        Speech: Speech normal.        Behavior: Behavior normal.        Thought Content: Thought content normal.        Cognition and Memory: Cognition and memory normal.        Judgment: Judgment  normal.     Visual Acuity Right Eye Distance:   Left Eye Distance:   Bilateral Distance:    Right Eye Near:   Left Eye Near:    Bilateral Near:     UC Couse / Diagnostics / Procedures:     Radiology No results found.  Procedures Procedures (including critical care time) EKG  Pending results:  Labs Reviewed - No data to display  Medications Ordered in UC: Medications - No data to display  UC Diagnoses / Final Clinical Impressions(s)   I have reviewed the triage vital signs and the nursing notes.  Pertinent labs & imaging results that were available during my care of the patient were reviewed by me and considered in my medical decision making (see chart for details).    Final diagnoses:  Viral respiratory illness  Rhinosinusitis  Laryngitis, acute   Physical exam findings concerning for uncontrolled allergies and viral respiratory infection.  Patient is well-appearing otherwise.  Due to duration of symptoms, viral testing not indicated.  Patient states she is been breast-feeding for 9 months and has more than ample supply of breastmilk.  Patient advised can resume Zyrtec at half dose for a few weeks and if her breastmilk is ineffective can begin taking full dose daily.  Patient also advised topical nasal steroid is safe to use during breast-feeding.  Patient asking about Mucinex, do not see any direct contraindications but per Micromedex harm to infant cannot be ruled out, patient advised. Please see discharge instructions below for further details of plan of care as provided to patient. ED Prescriptions     Medication Sig Dispense Auth. Provider   cetirizine (ZYRTEC ALLERGY) 10 MG tablet Take 1 tablet (10 mg total) by mouth at bedtime. 90 tablet Lynden Oxford Scales, PA-C   fluticasone (FLONASE) 50 MCG/ACT nasal spray Place 1 spray into both nostrils daily. Begin by using 2 sprays in each nare daily for 3 to 5 days, then decrease to 1 spray in each nare daily. 15.8 mL  Lynden Oxford Scales, PA-C   ibuprofen (ADVIL) 400 MG tablet Take 1 tablet (400 mg total) by mouth every 8 (eight) hours as needed for up to 30 doses. 30 tablet Lynden Oxford Scales, PA-C      PDMP not reviewed this encounter.  Disposition Upon Discharge:  Condition: stable for discharge home Home: take medications as prescribed; routine discharge instructions as discussed; follow up as advised.  Patient presented with an acute illness with associated systemic symptoms and significant discomfort requiring urgent management. In my opinion, this is a condition that a prudent lay person (someone who possesses an average knowledge of health and medicine) may potentially expect to result in complications if not addressed urgently such as respiratory distress, impairment of bodily function or dysfunction of bodily organs.   Routine symptom specific, illness specific and/or disease specific instructions were discussed with the patient and/or caregiver at length.   As such, the patient has been evaluated and assessed, work-up was performed and treatment was provided in alignment with urgent care protocols and evidence based medicine.  Patient/parent/caregiver has been advised that the patient may require follow up for further testing and treatment if the symptoms continue in spite of treatment, as clinically indicated and appropriate.  If the patient was tested for COVID-19, Influenza and/or RSV, then the patient/parent/guardian was advised to isolate at home pending the results of his/her diagnostic coronavirus test and potentially longer if they're positive. I have also advised pt that if his/her COVID-19 test returns positive, it's recommended to self-isolate for at least 10 days after symptoms first appeared AND until fever-free for 24 hours without fever reducer AND other symptoms have improved or resolved. Discussed self-isolation recommendations as well as instructions for household member/close  contacts as per the [facility] and [place] DHHS, and also gave patient the [place] packet with this information.  Patient/parent/caregiver has been advised to return to the [facility] or PCP in 3-5 days if no better; to PCP or the Emergency Department if new signs and symptoms develop, or if the current signs or symptoms continue to change or worsen for further workup, evaluation and treatment as clinically indicated and appropriate  The patient will follow up with their current PCP if and as advised. If the patient does not currently have a PCP we will assist them in obtaining one.   The patient may need specialty follow up if the symptoms continue, in spite of conservative treatment and management, for further workup, evaluation, consultation and treatment as clinically indicated and appropriate.  Patient/parent/caregiver verbalized understanding and agreement of plan as discussed.  All questions were addressed during visit.  Please see discharge instructions below for further details of plan.  Discharge Instructions:   Discharge Instructions      At this time, I believe that you are experiencing a viral illness that we will simply need to run its course.  I can recommend some medications that might improve your symptoms, I have listed them below.  In the meantime, please read below for medications that may help improve your symptoms:  Zyrtec (cetirizine): This is an excellent second-generation antihistamine that helps to reduce respiratory inflammatory response to environmental allergens.  In some patients, this medication can cause daytime sleepiness so I recommend that you take this medication at bedtime.  For the first 2 weeks, please take half tablet daily at bedtime then increase to full tablet as long as your breastmilk is still abundant and your child is still feeding well.   Flonase (fluticasone): This is a steroid nasal spray that you use once daily, 1 spray in each nare.  This medication does not work  well if you decide to use it only used as you feel you need to, it works best used on a daily basis.  After 3 to 5 days of use, you will notice significant reduction of the inflammation and mucus production that is currently being caused by exposure to allergens, whether seasonal or environmental.  This medication is not absorbed into the body or breastmilk.       Advil, Motrin (ibuprofen): This is a good anti-inflammatory medication which not only addresses aches, pains but also significantly reduces soft tissue inflammation of the upper airways that causes sinus and nasal congestion as well as inflammation of the lower airways which makes you feel like your breathing is constricted or your cough feel tight.  I recommend that you take 400 mg every 8 hours as needed.     Unfortunately, it appears that guaifenesin, the main ingredient in Robitussin and Mucinex, can be present in breastmilk and therefore is not recommended for breast-feeding mothers.  If you find that you have not had meaningful improvement of your symptoms in the next 5 to 7 days, please follow-up with your primary care provider or return here to urgent care for repeat evaluation and further recommendations.   If you have worsening symptoms such as a cough productive of dark or foul tasting sputum, fever greater than 101.5, shortness of breath, please come back sooner for repeat evaluation.  Thank you for visiting urgent care today.  We appreciate the opportunity to participate in your care.        This office note has been dictated using Museum/gallery curator.  Unfortunately, this method of dictation can sometimes lead to typographical or grammatical errors.  I apologize for your inconvenience in advance if this occurs.  Please do not hesitate to reach out to me if clarification is needed.      Lynden Oxford Scales, PA-C 01/17/22 [phone]

## 2022-01-17 NOTE — ED Triage Notes (Signed)
Pt reports sore throat, fever, chills, cough sinus pressure x 1 week; loss of voice since yesterday. Tylenol gives some relief. Pt is breastfeeding.

## 2022-01-17 NOTE — Discharge Instructions (Signed)
At this time, I believe that you are experiencing a viral illness that we will simply need to run its course.  I can recommend some medications that might improve your symptoms, I have listed them below.  In the meantime, please read below for medications that may help improve your symptoms:  Zyrtec (cetirizine): This is an excellent second-generation antihistamine that helps to reduce respiratory inflammatory response to environmental allergens.  In some patients, this medication can cause daytime sleepiness so I recommend that you take this medication at bedtime.  For the first 2 weeks, please take half tablet daily at bedtime then increase to full tablet as long as your breastmilk is still abundant and your child is still feeding well.   Flonase (fluticasone): This is a steroid nasal spray that you use once daily, 1 spray in each nare.  This medication does not work well if you decide to use it only used as you feel you need to, it works best used on a daily basis.  After 3 to 5 days of use, you will notice significant reduction of the inflammation and mucus production that is currently being caused by exposure to allergens, whether seasonal or environmental.  This medication is not absorbed into the body or breastmilk.       Advil, Motrin (ibuprofen): This is a good anti-inflammatory medication which not only addresses aches, pains but also significantly reduces soft tissue inflammation of the upper airways that causes sinus and nasal congestion as well as inflammation of the lower airways which makes you feel like your breathing is constricted or your cough feel tight.  I recommend that you take 400 mg every 8 hours as needed.     Unfortunately, it appears that guaifenesin, the main ingredient in Robitussin and Mucinex, can be present in breastmilk and therefore is not recommended for breast-feeding mothers.  If you find that you have not had meaningful improvement of your symptoms in the next 5 to  7 days, please follow-up with your primary care provider or return here to urgent care for repeat evaluation and further recommendations.   If you have worsening symptoms such as a cough productive of dark or foul tasting sputum, fever greater than 101.5, shortness of breath, please come back sooner for repeat evaluation.  Thank you for visiting urgent care today.  We appreciate the opportunity to participate in your care.

## 2022-02-24 NOTE — Progress Notes (Signed)
02/25/2022 Melissa Whitney [id number] 02-25-1980  Referring provider: Minette Brine, FNP Primary GI doctor: Dr. Bryan Lemma  ASSESSMENT AND PLAN:   Dyspepsia s/p cholecystectomy presents with similar discomfort associated with vomiting, worse after fatty meals Had slight elevation of alk phos in Oct Recheck labs, some concern for CBD stone versus gastritis Start on PPI daily Get AB [place] Check celiac panel Patient is concerned about pancreatitis/sphincter of oddi dysfunction, no ETOH, intermittent pain, will get lipase but low likelihood If not improving will plan on EGD versus cross sectional imaging   Patient Care Team: Minette Brine, FNP as PCP - General (General Practice)  HISTORY OF PRESENT ILLNESS: 42 y.o. female with a past medical history of hypothyroidism, bipolar, PCOS, cholelithiasis  s/p ccy 06/2021 and others listed below presents for evaluation of dyspepsia.   - 05/23/2021: ER evaluation for biliary colic.  WBC 12, otherwise normal CBC, CMP, lipase - 05/23/2021: CT A/P: Cholelithiasis, small splenic cysts vs splenic hemangiomas.  Normal GI tract, liver, pancreas - 05/24/2021: RUQ [place]: Cholelithiasis - 06/09/2021: Underwent uncomplicated laparoscopic cholecystectomy for chronic cholecystitis and cholelithiasis.  She states she has had a few episodes after her gallbladder surgery.  Having intermittent AB pain, can last 20 mins to one hour, has had vomiting, usually worse 2-3 hours after fatty meal.  Worse episode is she had [place] at dinner and 10 PM was vomiting.  Better with low fat diet.  She states if she has an "attack" the next day she will eat chicken broth and feels better.  Had low grade constant nausea with a heaviness or fullness in her stomach after eating. Has early satiety x 4 weeks.  Will have diarrhea first few days of low fat diet and then constipation. No melena, no hematochezia, has had some orange stools with low fat diet.  GERD since pregnancy,  pepcid/tums as needed, better with low fat diet.  The patient denies issues with jaundice, scleral icterus, dark urine, clay colored stool.  She denies Abdominal distention, LE edema. Denies generalized pruritus.  Maternal Aunt with acute pancreatitis and she is afraid it is from that.  She has not had any ETOH in 3 years.  She is on ibuprofen 5-6 times in last year.  Does not smoke and no drug use.  Has 76 month old daughter.   CBC  11/15/2021  HGB 14.7 MCV 82 without evidence of anemia WBC 8.4 Platelets 359 Kidney function 11/15/2021  BUN 12 Cr 0.99  GFR >60  Potassium 4.1   LFTs 11/15/2021  AST 23 ALT 28 Alkphos 128 TBili 0.3 05/23/2021 LIPASE 35 11/15/2021 TSH 42.400   Sister with Psoriatic Arthritis. No known Fhx of IBD, GI malignancy.   She  reports that she quit smoking about 2 years ago. Her smoking use included cigarettes. She smoked an average of 1 pack per day. She has never used smokeless tobacco. She reports that she does not currently use alcohol. She reports that she does not currently use drugs.  Current Medications:   Current Outpatient Medications (Endocrine & Metabolic):    levothyroxine (SYNTHROID) 112 MCG tablet, Take 1 tablet (112 mcg total) by mouth daily before breakfast.   Current Outpatient Medications (Respiratory):    cetirizine (ZYRTEC ALLERGY) 10 MG tablet, Take 1 tablet (10 mg total) by mouth at bedtime.   fluticasone (FLONASE) 50 MCG/ACT nasal spray, Place 1 spray into both nostrils daily. Begin by using 2 sprays in each nare daily for 3 to 5 days, then decrease  to 1 spray in each nare daily.  Current Outpatient Medications (Analgesics):    acetaminophen (TYLENOL) 500 MG tablet, Take 500 mg by mouth every 6 (six) hours as needed.   ibuprofen (ADVIL) 400 MG tablet, Take 1 tablet (400 mg total) by mouth every 8 (eight) hours as needed for up to 30 doses.   Current Outpatient Medications (Other):    pantoprazole (PROTONIX) 40 MG tablet, Take 1  tablet (40 mg total) by mouth daily.   sertraline (ZOLOFT) 100 MG tablet, Take 1 tablet (100 mg total) by mouth daily.  Medical History:  Past Medical History:  Diagnosis Date   Depression    Dysmenorrhea    Gallstone    GERD (gastroesophageal reflux disease)    with pregnancy   History of migraine    Hypothyroidism    Mood disorder ([place])    Neurologic cardiac syncope    no issues since age 47   PCOS (polycystic ovarian syndrome)    Pregnancy induced hypertension    Smoker 10/01/2018   Quit 06/18/2019   Toxic maculopathy    Vaginal Pap smear, abnormal    Allergies:  Allergies  Allergen Reactions   Other Itching and Swelling    Poppy Seed     Surgical History:  She  has a past surgical history that includes Appendectomy; Tonsillectomy and adenoidectomy; Colposcopy; Gynecologic cryosurgery; Wisdom tooth extraction; and Cholecystectomy (N/A, 06/09/2021). Family History:  Her family history includes Arthritis in her sister; COPD in her mother; Endometriosis in her mother and sister; Esophageal cancer in her paternal grandmother; Heart attack in her father; Heart disease in her father and maternal grandmother; Thyroid disease in her maternal grandmother, mother, and sister.  REVIEW OF SYSTEMS  : All other systems reviewed and negative except where noted in the History of Present Illness.  PHYSICAL EXAM: BP (!) 130/90   Pulse 63   Ht 5' 7.5" (1.715 m)   Wt 244 lb 8 oz (110.9 kg)   BMI 37.73 kg/m  General:   Pleasant, well developed female in no acute distress Head:   Normocephalic and atraumatic. Eyes:  sclerae anicteric,conjunctive pink  Heart:   regular rate and rhythm Pulm:  Clear anteriorly; no wheezing Abdomen:   Soft, Obese AB, Active bowel sounds. mild tenderness in the epigastrium and in the RUQ. Without guarding and Without rebound, No organomegaly appreciated. Rectal: Not evaluated Extremities:  Without edema. Msk: Symmetrical without gross deformities.  Peripheral pulses intact.  Neurologic:  Alert and  oriented x4;  No focal deficits.  Skin:   Dry and intact without significant lesions or rashes. Psychiatric:  Cooperative. Normal mood and affect.  RELEVANT LABS AND IMAGING: CBC    Component Value Date/Time   WBC 8.4 11/15/2021 1651   WBC 12.1 (H) 05/23/2021 1859   RBC 5.56 (H) 11/15/2021 1651   RBC 5.42 (H) 05/23/2021 1859   HGB 14.7 11/15/2021 1651   HGB 12.3 01/25/2021 0000   HCT 45.3 11/15/2021 1651   HCT 36 01/25/2021 0000   PLT 359 11/15/2021 1651   PLT 323 01/25/2021 0000   MCV 82 11/15/2021 1651   MCH 26.4 (L) 11/15/2021 1651   MCH 26.0 05/23/2021 1859   MCHC 32.5 11/15/2021 1651   MCHC 32.6 05/23/2021 1859   RDW 12.5 11/15/2021 1651   LYMPHSABS 3.8 04/17/2021 0506   MONOABS 0.7 04/17/2021 0506   EOSABS 0.3 04/17/2021 0506   BASOSABS 0.1 04/17/2021 0506    CMP     Component Value Date/Time  NA 142 11/15/2021 1651   K 4.1 11/15/2021 1651   CL 103 11/15/2021 1651   CO2 25 11/15/2021 1651   GLUCOSE 78 11/15/2021 1651   GLUCOSE 90 05/23/2021 1859   BUN 12 11/15/2021 1651   CREATININE 0.99 11/15/2021 1651   CREATININE 0.96 10/01/2018 0951   CALCIUM 10.0 11/15/2021 1651   PROT 7.5 11/15/2021 1651   ALBUMIN 4.9 11/15/2021 1651   AST 23 11/15/2021 1651   ALT 28 11/15/2021 1651   ALKPHOS 128 (H) 11/15/2021 1651   BILITOT 0.3 11/15/2021 1651   GFRNONAA >60 05/23/2021 [place] Locklyn Henriquez, PA-C 3:35 PM

## 2022-02-25 ENCOUNTER — Other Ambulatory Visit (INDEPENDENT_AMBULATORY_CARE_PROVIDER_SITE_OTHER): Payer: Medicaid Other

## 2022-02-25 ENCOUNTER — Ambulatory Visit (INDEPENDENT_AMBULATORY_CARE_PROVIDER_SITE_OTHER): Payer: Medicaid Other | Admitting: Physician Assistant

## 2022-02-25 ENCOUNTER — Encounter: Payer: Self-pay | Admitting: Physician Assistant

## 2022-02-25 VITALS — BP 130/90 | HR 63 | Ht 67.5 in | Wt 244.5 lb

## 2022-02-25 DIAGNOSIS — R1013 Epigastric pain: Secondary | ICD-10-CM | POA: Diagnosis not present

## 2022-02-25 DIAGNOSIS — R140 Abdominal distension (gaseous): Secondary | ICD-10-CM

## 2022-02-25 LAB — COMPREHENSIVE METABOLIC PANEL
ALT: 12 U/L (ref 0–35)
AST: 13 U/L (ref 0–37)
Albumin: 4.5 g/dL (ref 3.5–5.2)
Alkaline Phosphatase: 96 U/L (ref 39–117)
BUN: 22 mg/dL (ref 6–23)
CO2: 24 mEq/L (ref 19–32)
Calcium: 9.7 mg/dL (ref 8.4–10.5)
Chloride: 108 mEq/L (ref 96–112)
Creatinine, Ser: 0.85 mg/dL (ref 0.40–1.20)
GFR: 86.45 mL/min (ref >60.00)
Glucose, Bld: 80 mg/dL (ref 70–99)
Potassium: 3.8 mEq/L (ref 3.5–5.1)
Sodium: 142 mEq/L (ref 135–145)
Total Bilirubin: 0.3 mg/dL (ref 0.2–1.2)
Total Protein: 7.5 g/dL (ref 6.0–8.3)

## 2022-02-25 LAB — CBC WITH DIFFERENTIAL/PLATELET
Basophils Absolute: 0.1 10*3/uL (ref 0.0–0.1)
Basophils Relative: 1 % (ref 0.0–3.0)
Eosinophils Absolute: 0.1 10*3/uL (ref 0.0–0.7)
Eosinophils Relative: 1.8 % (ref 0.0–5.0)
HCT: 40.1 % (ref 36.0–46.0)
Hemoglobin: 13.6 g/dL (ref 12.0–15.0)
Lymphocytes Relative: 35.9 % (ref 12.0–46.0)
Lymphs Abs: 2.8 10*3/uL (ref 0.7–4.0)
MCHC: 33.8 g/dL (ref 30.0–36.0)
MCV: 79.8 fl (ref 78.0–100.0)
Monocytes Absolute: 0.5 10*3/uL (ref 0.1–1.0)
Monocytes Relative: 6.8 % (ref 3.0–12.0)
Neutro Abs: 4.3 10*3/uL (ref 1.4–7.7)
Neutrophils Relative %: 54.5 % (ref 43.0–77.0)
Platelets: 316 10*3/uL (ref 150.0–400.0)
RBC: 5.03 Mil/uL (ref 3.87–5.11)
RDW: 13.4 % (ref 11.5–15.5)
WBC: 7.8 10*3/uL (ref 4.0–10.5)

## 2022-02-25 LAB — LIPASE: Lipase: 34 U/L (ref 11.0–59.0)

## 2022-02-25 MED ORDER — PANTOPRAZOLE SODIUM 40 MG PO TBEC: 40.0000 mg | DELAYED_RELEASE_TABLET | Freq: Every day | ORAL | 3 refills | Status: DC

## 2022-02-25 NOTE — Patient Instructions (Signed)
_______________________________________________________  If your blood pressure at your visit was 140/90 or greater, please contact your primary care physician to follow up on this.  _______________________________________________________  If you are age 40 or older, your body mass index should be between 23-30. Your Body mass index is 37.73 kg/m. If this is out of the aforementioned range listed, please consider follow up with your Primary Care Provider.  If you are age 45 or younger, your body mass index should be between 19-25. Your Body mass index is 37.73 kg/m. If this is out of the aformentioned range listed, please consider follow up with your Primary Care Provider.   ________________________________________________________  The [facility] providers would like to encourage you to use [facility] to communicate with providers for non-urgent requests or questions.  Due to long hold times on the telephone, sending your provider a message by [facility] may be a faster and more efficient way to get a response.  Please allow 48 business hours for a response.  Please remember that this is for non-urgent requests.  _______________________________________________________   Your provider has requested that you go to the basement level for lab work before leaving today. Press "B" on the elevator. The lab is located at the first door on the left as you exit the elevator.  Please take your proton pump inhibitor medication, protonix   Please take this medication 30 minutes to 1 hour before meals- this makes it more effective.  Avoid spicy and acidic foods Avoid fatty foods Limit your intake of coffee, tea, alcohol, and carbonated drinks Work to maintain a healthy weight Keep the head of the bed elevated at least 3 inches with blocks or a wedge pillow if you are having any nighttime symptoms Stay upright for 2 hours after eating Avoid meals and snacks three to four hours before bedtime  You have been  scheduled for an abdominal ultrasound at [facility] (1st floor of hospital) on 03-04-2022 at 7am. Please arrive 15 minutes prior to your appointment for registration. Make certain not to have anything to eat or drink 6 hours prior to your appointment. Should you need to reschedule your appointment, please contact radiology at [phone]. This test typically takes about 30 minutes to perform.  It was a pleasure to see you today!  Thank you for trusting me with your gastrointestinal care!

## 2022-02-28 LAB — IGA: Immunoglobulin A: 237 mg/dL (ref 47–310)

## 2022-02-28 LAB — TISSUE TRANSGLUTAMINASE, IGA: (tTG) Ab, IgA: <1 U/mL

## 2022-03-02 NOTE — Progress Notes (Signed)
Agree with the assessment and plan as outlined by Lavere Collier, PA-C. ? ?Tayquan Gassman, DO, FACG ? ?

## 2022-03-04 ENCOUNTER — Ambulatory Visit (HOSPITAL_COMMUNITY)
Admission: RE | Admit: 2022-03-04 | Discharge: 2022-03-04 | Disposition: A | Discharge status: Home / Self Care | Payer: Medicaid Other | Source: Ambulatory Visit | Attending: Physician Assistant | Admitting: Physician Assistant

## 2022-03-04 ENCOUNTER — Other Ambulatory Visit: Payer: Self-pay | Admitting: Nurse Practitioner

## 2022-03-04 DIAGNOSIS — R140 Abdominal distension (gaseous): Secondary | ICD-10-CM

## 2022-03-04 DIAGNOSIS — R1013 Epigastric pain: Secondary | ICD-10-CM | POA: Insufficient documentation

## 2022-03-08 ENCOUNTER — Other Ambulatory Visit: Payer: Self-pay

## 2022-03-08 ENCOUNTER — Encounter: Payer: Self-pay | Admitting: Gastroenterology

## 2022-03-08 DIAGNOSIS — K760 Fatty (change of) liver, not elsewhere classified: Secondary | ICD-10-CM

## 2022-03-08 NOTE — Telephone Encounter (Signed)
Will refer patient to weight loss clinic.  Has been doing better on PPI, if patient continues to have discomfort can consider endoscopy.

## 2022-03-20 ENCOUNTER — Other Ambulatory Visit: Payer: Self-pay | Admitting: Nurse Practitioner

## 2022-03-20 DIAGNOSIS — F3181 Bipolar II disorder: Secondary | ICD-10-CM

## 2022-03-31 ENCOUNTER — Encounter: Payer: Medicaid Other | Admitting: Nurse Practitioner

## 2022-05-12 ENCOUNTER — Other Ambulatory Visit: Payer: Self-pay

## 2022-05-12 DIAGNOSIS — R1013 Epigastric pain: Secondary | ICD-10-CM

## 2022-05-12 DIAGNOSIS — F3181 Bipolar II disorder: Secondary | ICD-10-CM

## 2022-05-12 DIAGNOSIS — R140 Abdominal distension (gaseous): Secondary | ICD-10-CM

## 2022-05-12 MED ORDER — SERTRALINE HCL 100 MG PO TABS: 100.0000 mg | ORAL_TABLET | Freq: Every day | ORAL | 0 refills | Status: DC

## 2022-05-12 MED ORDER — PANTOPRAZOLE SODIUM 40 MG PO TBEC: 40.0000 mg | DELAYED_RELEASE_TABLET | Freq: Every day | ORAL | 3 refills | Status: DC

## 2022-05-13 ENCOUNTER — Telehealth: Payer: Self-pay | Admitting: Physician Assistant

## 2022-05-13 DIAGNOSIS — R1013 Epigastric pain: Secondary | ICD-10-CM

## 2022-05-13 DIAGNOSIS — R140 Abdominal distension (gaseous): Secondary | ICD-10-CM

## 2022-05-13 MED ORDER — PANTOPRAZOLE SODIUM 40 MG PO TBEC: 40.0000 mg | DELAYED_RELEASE_TABLET | Freq: Every day | ORAL | 3 refills | Status: DC

## 2022-05-13 NOTE — Telephone Encounter (Signed)
Inbound call from Pharmacy technician trying to have a refill for pantoprazole .please advise

## 2022-05-13 NOTE — Telephone Encounter (Signed)
Pantoprazole refilled. 

## 2022-05-16 ENCOUNTER — Other Ambulatory Visit: Payer: Self-pay

## 2022-05-16 MED ORDER — LEVOTHYROXINE SODIUM 112 MCG PO TABS: 112.0000 ug | ORAL_TABLET | Freq: Every day | ORAL | 0 refills | Status: DC

## 2022-05-23 ENCOUNTER — Other Ambulatory Visit: Payer: Self-pay | Admitting: Nurse Practitioner

## 2022-05-30 ENCOUNTER — Ambulatory Visit (INDEPENDENT_AMBULATORY_CARE_PROVIDER_SITE_OTHER): Payer: Medicaid Other | Admitting: Obstetrics and Gynecology

## 2022-05-30 ENCOUNTER — Other Ambulatory Visit: Payer: Self-pay

## 2022-05-30 ENCOUNTER — Encounter: Payer: Self-pay | Admitting: Obstetrics and Gynecology

## 2022-05-30 ENCOUNTER — Other Ambulatory Visit (HOSPITAL_COMMUNITY)
Admission: RE | Admit: 2022-05-30 | Discharge: 2022-05-30 | Disposition: A | Discharge status: Home / Self Care | Payer: Medicaid Other | Source: Ambulatory Visit | Attending: Obstetrics and Gynecology | Admitting: Obstetrics and Gynecology

## 2022-05-30 VITALS — BP 114/81 | HR 80 | Wt 243.1 lb

## 2022-05-30 DIAGNOSIS — Z124 Encounter for screening for malignant neoplasm of cervix: Secondary | ICD-10-CM | POA: Insufficient documentation

## 2022-05-30 DIAGNOSIS — Z01419 Encounter for gynecological examination (general) (routine) without abnormal findings: Secondary | ICD-10-CM | POA: Diagnosis present

## 2022-05-30 DIAGNOSIS — Z3169 Encounter for other general counseling and advice on procreation: Secondary | ICD-10-CM

## 2022-05-30 NOTE — Progress Notes (Signed)
ANNUAL EXAM Patient name: Melissa Whitney MRN [id number]  Date of birth: 09-30-1980 Chief Complaint:   Gynecologic Exam  History of Present Illness:   Melissa Whitney is a 42 y.o. G1P1001 being seen today for a routine annual exam.  Current complaints: annual, fertility   Menstrual concerns? Yes  - menses not yet resumed, still breastfeeding 3-4x daily  Breast or nipple changes? No  Contraception use? No  Sexually active? Yes   For prior pregnancy: tried 11 months and did provera/letrozole for 2 months and then was able to conceive  No LMP recorded. (Menstrual status: Lactating).  Last pap No results found for: "DIAGPAP", "HPVHIGH", "ADEQPAP"  H/O abnormal pap: yes - several years ago, had a colposcopy and normal pap smears since Last mammogram: n/a Last colonoscopy: n/a     11/15/2021    3:40 PM 03/17/2021    4:19 PM 03/02/2021    2:34 PM 05/20/2015    4:05 PM 09/05/2014   11:16 AM  Depression screen PHQ 2/9  Decreased Interest 0 0 0 0 0  Down, Depressed, Hopeless 0 0 0 0 0  PHQ - 2 Score 0 0 0 0 0  Altered sleeping 0 0 1    Tired, decreased energy 3 0 1    Change in appetite 3 0 0    Feeling bad or failure about yourself  0 0 0    Trouble concentrating 0 0 0    Moving slowly or fidgety/restless 0 0 0    Suicidal thoughts 0 0 0    PHQ-9 Score 6 0 2    Difficult doing work/chores Not difficult at all            03/17/2021    4:19 PM 03/02/2021    2:35 PM  GAD 7 : Generalized Anxiety Score  Nervous, Anxious, on Edge 0 0  Control/stop worrying 0 0  Worry too much - different things 0 0  Trouble relaxing 0 0  Restless 0 0  Easily annoyed or irritable 0 0  Afraid - awful might happen 0 0  Total GAD 7 Score 0 0     Review of Systems:   Pertinent items are noted in HPI Denies any headaches, blurred vision, fatigue, shortness of breath, chest pain, abdominal pain, abnormal vaginal discharge/itching/odor/irritation, problems with periods, bowel movements, urination,  or intercourse unless otherwise stated above. Pertinent History Reviewed:  Reviewed past medical,surgical, social and family history.  Reviewed problem list, medications and allergies. Physical Assessment:   Vitals:   05/30/22 1514  BP: 114/81  Pulse: 80  Weight: 243 lb 1.6 oz (110.3 kg)  Body mass index is 37.51 kg/m.        Physical Examination:   General appearance - well appearing, and in no distress  Mental status - alert, oriented to person, place, and time  Psych:  She has a normal mood and affect  Skin - warm and dry, normal color, no suspicious lesions noted  Chest - effort normal, all lung fields clear to auscultation bilaterally  Heart - normal rate and regular rhythm  Abdomen - soft, nontender, nondistended, no masses or organomegaly  Pelvic -  VULVA: normal appearing vulva with no masses, tenderness or lesions   VAGINA: normal appearing vagina with normal color and discharge, no lesions   CERVIX: normal appearing cervix without discharge or lesions, no CMT  Thin prep pap is done with HR HPV cotesting  Extremities:  No swelling or varicosities noted  Chaperone  present for exam  No results found for this or any previous visit (from the past 24 hour(s)).    Assessment & Plan:  1. Well woman exam with routine gynecological exam - Cervical cancer screening: Discussed guidelines. Pap with HPV collected - GC/CT: not indicated - Birth Control:  none - Breast Health: Encouraged self breast awareness/SBE. Teaching provided.  - F/U 12 months and prn - Cytology - PAP  2. Screening for cervical cancer Routine pap collected - Cytology - PAP  3. Encounter for preconception consultation Reviewed that may need ovulation induction again. Goal is pregnancy by November. Noted that while breastfeeding at this frequency and no return of menses, likely not ovulating. Once breastfeeding has weaned, can try for a few months but would not try for mote than 3-6 months since  ovulation induction needed for prior conception. Advise to notify us when breastfeeding mostly weaned to discuss resumption of meds to conceive.    No orders of the defined types were placed in this encounter.   Meds: No orders of the defined types were placed in this encounter.   Follow-up: No follow-ups on file.  Lorriane Shire, MD 05/30/2022 3:20 PM

## 2022-05-31 ENCOUNTER — Other Ambulatory Visit: Payer: Self-pay | Admitting: Physician Assistant

## 2022-05-31 DIAGNOSIS — R1013 Epigastric pain: Secondary | ICD-10-CM

## 2022-05-31 DIAGNOSIS — R140 Abdominal distension (gaseous): Secondary | ICD-10-CM

## 2022-05-31 MED ORDER — PANTOPRAZOLE SODIUM 40 MG PO TBEC
40.0000 mg | DELAYED_RELEASE_TABLET | Freq: Every day | ORAL | 3 refills | Status: DC
Start: 2022-05-31 — End: 2022-07-06

## 2022-06-02 LAB — CYTOLOGY - PAP
Comment: NEGATIVE
Diagnosis: NEGATIVE
High risk HPV: NEGATIVE

## 2022-07-06 ENCOUNTER — Encounter: Payer: Self-pay | Admitting: Nurse Practitioner

## 2022-07-06 ENCOUNTER — Ambulatory Visit: Payer: Medicaid Other | Admitting: Nurse Practitioner

## 2022-07-06 VITALS — BP 120/64 | HR 85 | Temp 98.4°F | Ht 67.0 in | Wt 239.6 lb

## 2022-07-06 DIAGNOSIS — Z6836 Body mass index (BMI) 36.0-36.9, adult: Secondary | ICD-10-CM

## 2022-07-06 DIAGNOSIS — R140 Abdominal distension (gaseous): Secondary | ICD-10-CM

## 2022-07-06 DIAGNOSIS — E039 Hypothyroidism, unspecified: Secondary | ICD-10-CM

## 2022-07-06 DIAGNOSIS — R1013 Epigastric pain: Secondary | ICD-10-CM

## 2022-07-06 DIAGNOSIS — E6609 Other obesity due to excess calories: Secondary | ICD-10-CM

## 2022-07-06 DIAGNOSIS — F3181 Bipolar II disorder: Secondary | ICD-10-CM

## 2022-07-06 DIAGNOSIS — F3341 Major depressive disorder, recurrent, in partial remission: Secondary | ICD-10-CM | POA: Insufficient documentation

## 2022-07-06 DIAGNOSIS — Z79899 Other long term (current) drug therapy: Secondary | ICD-10-CM

## 2022-07-06 MED ORDER — LEVOTHYROXINE SODIUM 112 MCG PO TABS: 112.0000 ug | ORAL_TABLET | Freq: Every day | ORAL | 1 refills | Status: DC

## 2022-07-06 MED ORDER — PANTOPRAZOLE SODIUM 40 MG PO TBEC
40.0000 mg | DELAYED_RELEASE_TABLET | Freq: Every day | ORAL | 1 refills | Status: DC
Start: 2022-07-06 — End: 2023-07-14

## 2022-07-06 MED ORDER — SERTRALINE HCL 100 MG PO TABS: 100.0000 mg | ORAL_TABLET | Freq: Every day | ORAL | 1 refills | Status: DC

## 2022-07-06 NOTE — Progress Notes (Addendum)
Hershal Coria Martin,acting as a Neurosurgeon for Arnette Felts, FNP.,have documented all relevant documentation on the behalf of Arnette Felts, FNP,as directed by  Arnette Felts, FNP while in the presence of Arnette Felts, FNP.    Subjective:     Patient ID: Melissa Whitney , female    DOB: Oct 16, 1980 , 42 y.o.   MRN: [id number]   Chief Complaint  Patient presents with   Medication Refill    HPI  Patient presents today for medication management. Patient reports compliance with medications and has no other concerns today. She is breast feeding her 46 month old  BP Readings from Last 3 Encounters: 07/06/22 : 120/64 05/30/22 : 114/81 02/25/22 : (!) 130/90  Wt Readings from Last 3 Encounters: 07/06/22 : 239 lb 9.6 oz (108.7 kg) 05/30/22 : 243 lb 1.6 oz (110.3 kg) 02/25/22 : 244 lb 8 oz (110.9 kg)  She is doing weight watchers for her diet. She is walking with her daughter about 5 miles per day       Past Medical History:  Diagnosis Date   Depression    Dysmenorrhea    Gallstone    GERD (gastroesophageal reflux disease)    with pregnancy   History of migraine    Hypothyroidism    Mood disorder (HCC)    Neurologic cardiac syncope    no issues since age 58   PCOS (polycystic ovarian syndrome)    Pregnancy induced hypertension    Smoker 10/01/2018   Quit 06/18/2019   Toxic maculopathy    Vaginal Pap smear, abnormal      Family History  Problem Relation Age of Onset   Thyroid disease Mother    Endometriosis Mother    COPD Mother    Heart disease Father    Heart attack Father    Arthritis Sister        psoriatic   Thyroid disease Sister    Endometriosis Sister    Thyroid disease Maternal Grandmother    Heart disease Maternal Grandmother    Esophageal cancer Paternal Grandmother      Current Outpatient Medications:    fluticasone (FLONASE) 50 MCG/ACT nasal spray, Place 1 spray into both nostrils daily. Begin by using 2 sprays in each nare daily for 3 to 5 days, then  decrease to 1 spray in each nare daily. (Patient not taking: Reported on 05/30/2022), Disp: 15.8 mL, Rfl: 2   ibuprofen (ADVIL) 400 MG tablet, Take 1 tablet (400 mg total) by mouth every 8 (eight) hours as needed for up to 30 doses. (Patient not taking: Reported on 07/06/2022), Disp: 30 tablet, Rfl: 0   levothyroxine (SYNTHROID) 112 MCG tablet, Take 1 tablet (112 mcg total) by mouth daily before breakfast. Mon - Saturday and 2 tabs on Sunday, Disp: 112 tablet, Rfl: 1   pantoprazole (PROTONIX) 40 MG tablet, Take 1 tablet (40 mg total) by mouth daily., Disp: 90 tablet, Rfl: 1   sertraline (ZOLOFT) 100 MG tablet, Take 1 tablet (100 mg total) by mouth daily., Disp: 90 tablet, Rfl: 1   Allergies  Allergen Reactions   Other Itching and Swelling    Poppy Seed     Review of Systems  Constitutional: Negative.  Negative for appetite change and fatigue.  Respiratory: Negative.    Cardiovascular: Negative.   Endocrine: Negative for cold intolerance and heat intolerance.  Skin: Negative.   Neurological: Negative.  Negative for dizziness, light-headedness and headaches.  Psychiatric/Behavioral:  Negative for behavioral problems and dysphoric mood. The patient is  not nervous/anxious.      Today's Vitals   07/06/22 1620  BP: 120/64  Pulse: 85  Temp: 98.4 F (36.9 C)  TempSrc: Oral  Weight: 239 lb 9.6 oz (108.7 kg)  Height: 5\' 7"  (1.702 m)  PainSc: 0-No pain   Body mass index is 37.53 kg/m.  Wt Readings from Last 3 Encounters:  07/06/22 239 lb 9.6 oz (108.7 kg)  05/30/22 243 lb 1.6 oz (110.3 kg)  02/25/22 244 lb 8 oz (110.9 kg)     Objective:  Physical Exam Vitals reviewed.  Constitutional:      Appearance: Normal appearance. She is well-developed.  HENT:     Head: Normocephalic and atraumatic.  Eyes:     Pupils: Pupils are equal, round, and reactive to light.  Cardiovascular:     Rate and Rhythm: Normal rate and regular rhythm.     Pulses: Normal pulses.     Heart sounds: Normal  heart sounds. No murmur heard. Pulmonary:     Effort: Pulmonary effort is normal.     Breath sounds: Normal breath sounds.  Musculoskeletal:        General: Normal range of motion.  Skin:    General: Skin is warm and dry.     Capillary Refill: Capillary refill takes less than 2 seconds.  Neurological:     General: No focal deficit present.     Mental Status: She is alert and oriented to person, place, and time.     Cranial Nerves: No cranial nerve deficit.  Psychiatric:        Mood and Affect: Mood normal.         Assessment And Plan:     1. Acquired hypothyroidism Comments: Chronic, controlled, continue current medications. - levothyroxine (SYNTHROID) 112 MCG tablet; Take 1 tablet (112 mcg total) by mouth daily before breakfast. Mon - Saturday and 2 tabs on Sunday  Dispense: 112 tablet; Refill: 1 - TSH + free T4; Future  2. Bipolar II disorder (HCC) Comments: Denies any episodes.  No issues, will refill her sertraline. - sertraline (ZOLOFT) 100 MG tablet; Take 1 tablet (100 mg total) by mouth daily.  Dispense: 90 tablet; Refill: 1 - TSH + free T4 - Comprehensive metabolic panel  3. Other long term (current) drug therapy  4. Dyspepsia Comments: Continue current medications. - pantoprazole (PROTONIX) 40 MG tablet; Take 1 tablet (40 mg total) by mouth daily.  Dispense: 90 tablet; Refill: 1  5. Bloating  6. Class 2 obesity due to excess calories with body mass index (BMI) of 36.0 to 36.9 in adult, unspecified whether serious comorbidity present Comments: Continues to breast feed so she is unable to take any weight loss medications at this time.    Return in about 2 months (around 09/05/2022) for phy when able.  Patient was given opportunity to ask questions. Patient verbalized understanding of the plan and was able to repeat key elements of the plan. All questions were answered to their satisfaction.  Arnette Felts, FNP   I, Arnette Felts, FNP, have reviewed all  documentation for this visit. The documentation on 07/06/22 for the exam, diagnosis, procedures, and orders are all accurate and complete.   IF YOU HAVE BEEN REFERRED TO A SPECIALIST, IT MAY TAKE 1-2 WEEKS TO SCHEDULE/PROCESS THE REFERRAL. IF YOU HAVE NOT HEARD FROM US/SPECIALIST IN TWO WEEKS, PLEASE GIVE [place] A CALL AT [phone] X [phone].   THE PATIENT IS ENCOURAGED TO PRACTICE SOCIAL DISTANCING DUE TO THE COVID-19 PANDEMIC.

## 2022-07-07 LAB — COMPREHENSIVE METABOLIC PANEL
ALT: 24 IU/L (ref 0–32)
AST: 21 IU/L (ref 0–40)
Albumin/Globulin Ratio: 1.6 (ref 1.2–2.2)
Albumin: 4.6 g/dL (ref 3.9–4.9)
Alkaline Phosphatase: 125 IU/L — ABNORMAL HIGH (ref 44–121)
BUN/Creatinine Ratio: 17 (ref 9–23)
BUN: 17 mg/dL (ref 6–20)
Bilirubin Total: 0.6 mg/dL (ref 0.0–1.2)
CO2: 21 mmol/L (ref 20–29)
Calcium: 9.8 mg/dL (ref 8.7–10.2)
Chloride: 103 mmol/L (ref 96–106)
Creatinine, Ser: 0.99 mg/dL (ref 0.57–1.00)
Globulin, Total: 2.8 g/dL (ref 1.5–4.5)
Glucose: 74 mg/dL (ref 70–99)
Potassium: 4.1 mmol/L (ref 3.5–5.2)
Sodium: 141 mmol/L (ref 134–144)
Total Protein: 7.4 g/dL (ref 6.0–8.5)
eGFR: 74 mL/min/{1.73_m2} (ref >59)

## 2022-07-07 LAB — TSH+FREE T4
Free T4: 1.07 ng/dL (ref 0.82–1.77)
TSH: 10.3 u[IU]/mL — ABNORMAL HIGH (ref 0.450–4.500)

## 2022-07-16 DIAGNOSIS — R1013 Epigastric pain: Secondary | ICD-10-CM | POA: Insufficient documentation

## 2022-07-16 DIAGNOSIS — R140 Abdominal distension (gaseous): Secondary | ICD-10-CM | POA: Insufficient documentation

## 2022-07-16 MED ORDER — LEVOTHYROXINE SODIUM 112 MCG PO TABS: 112.0000 ug | ORAL_TABLET | Freq: Every day | ORAL | 1 refills | Status: DC

## 2022-07-18 ENCOUNTER — Other Ambulatory Visit: Payer: Self-pay

## 2022-07-18 DIAGNOSIS — K760 Fatty (change of) liver, not elsewhere classified: Secondary | ICD-10-CM

## 2022-09-06 ENCOUNTER — Encounter: Payer: Medicaid Other | Admitting: Nurse Practitioner

## 2022-11-14 ENCOUNTER — Emergency Department (HOSPITAL_BASED_OUTPATIENT_CLINIC_OR_DEPARTMENT_OTHER): Payer: Medicaid Other | Admitting: Radiology

## 2022-11-14 ENCOUNTER — Emergency Department (HOSPITAL_BASED_OUTPATIENT_CLINIC_OR_DEPARTMENT_OTHER)
Admission: EM | Admit: 2022-11-14 | Discharge: 2022-11-14 | Disposition: A | Discharge status: Home / Self Care | Payer: Medicaid Other | Attending: Emergency Medicine | Admitting: Emergency Medicine

## 2022-11-14 ENCOUNTER — Encounter (HOSPITAL_BASED_OUTPATIENT_CLINIC_OR_DEPARTMENT_OTHER): Payer: Self-pay | Admitting: Emergency Medicine

## 2022-11-14 ENCOUNTER — Telehealth: Payer: Self-pay | Admitting: *Deleted

## 2022-11-14 ENCOUNTER — Other Ambulatory Visit: Payer: Self-pay

## 2022-11-14 DIAGNOSIS — K30 Functional dyspepsia: Secondary | ICD-10-CM | POA: Insufficient documentation

## 2022-11-14 DIAGNOSIS — R079 Chest pain, unspecified: Secondary | ICD-10-CM | POA: Diagnosis present

## 2022-11-14 LAB — TROPONIN I (HIGH SENSITIVITY): Troponin I (High Sensitivity): 3 ng/L (ref <18)

## 2022-11-14 LAB — CBC
HCT: 41.7 % (ref 36.0–46.0)
Hemoglobin: 14.2 g/dL (ref 12.0–15.0)
MCH: 27 pg (ref 26.0–34.0)
MCHC: 34.1 g/dL (ref 30.0–36.0)
MCV: 79.3 fL — ABNORMAL LOW (ref 80.0–100.0)
Platelets: 313 10*3/uL (ref 150–400)
RBC: 5.26 MIL/uL — ABNORMAL HIGH (ref 3.87–5.11)
RDW: 13.5 % (ref 11.5–15.5)
WBC: 7.5 10*3/uL (ref 4.0–10.5)
nRBC: 0 % (ref 0.0–0.2)

## 2022-11-14 LAB — BASIC METABOLIC PANEL
Anion gap: 10 (ref 5–15)
BUN: 14 mg/dL (ref 6–20)
CO2: 27 mmol/L (ref 22–32)
Calcium: 9.7 mg/dL (ref 8.9–10.3)
Chloride: 102 mmol/L (ref 98–111)
Creatinine, Ser: 0.94 mg/dL (ref 0.44–1.00)
GFR, Estimated: >60 mL/min (ref >60)
Glucose, Bld: 101 mg/dL — ABNORMAL HIGH (ref 70–99)
Potassium: 3.7 mmol/L (ref 3.5–5.1)
Sodium: 139 mmol/L (ref 135–145)

## 2022-11-14 MED ORDER — ONDANSETRON 4 MG PO TBDP: 4.0000 mg | ORAL_TABLET | Freq: Once | ORAL | Status: AC | PRN

## 2022-11-14 MED ORDER — FAMOTIDINE 20 MG PO TABS
20.0000 mg | ORAL_TABLET | Freq: Once | ORAL | Status: AC
  Administered 2022-11-14: 20 mg via ORAL
  Filled 2022-11-14: qty 1

## 2022-11-14 MED ORDER — LIDOCAINE VISCOUS HCL 2 % MT SOLN
15.0000 mL | Freq: Once | OROMUCOSAL | Status: AC
  Administered 2022-11-14: 15 mL via ORAL
  Filled 2022-11-14: qty 15

## 2022-11-14 MED ORDER — FAMOTIDINE 20 MG PO TABS: 20.0000 mg | ORAL_TABLET | Freq: Two times a day (BID) | ORAL | 0 refills | Status: DC

## 2022-11-14 MED ORDER — SUCRALFATE 1 GM/10ML PO SUSP: 1.0000 g | Freq: Three times a day (TID) | ORAL | 0 refills | Status: DC

## 2022-11-14 MED ORDER — ONDANSETRON 4 MG PO TBDP
ORAL_TABLET | ORAL | Status: AC
  Administered 2022-11-14: 4 mg via ORAL
  Filled 2022-11-14: qty 1

## 2022-11-14 MED ORDER — PANTOPRAZOLE SODIUM 40 MG PO TBEC
40.0000 mg | DELAYED_RELEASE_TABLET | Freq: Once | ORAL | Status: AC
  Administered 2022-11-14: 40 mg via ORAL
  Filled 2022-11-14: qty 1

## 2022-11-14 MED ORDER — ALUM & MAG HYDROXIDE-SIMETH 200-200-20 MG/5ML PO SUSP
30.0000 mL | Freq: Once | ORAL | Status: AC
  Administered 2022-11-14: 30 mL via ORAL
  Filled 2022-11-14: qty 30

## 2022-11-14 NOTE — ED Provider Notes (Signed)
[facility] AT [facility] Provider Note   CSN: [id number] Arrival date & time: 11/14/22  0004     History  Chief Complaint  Patient presents with   Chest Pain    Melissa Whitney is a 42 y.o. female.  Patient has a long history of what sounds like gastritis or esophagitis.  She states that it has been flaring up more often recently and she has been taking Mounjaro over the last few months with a significant mount of weight loss.  She states that tonight the pain was worse than she ever has had before.  Started about an hour after she ate couple slices of pizza.  She states that her stomach felt full, distended and she was significant nauseous.  She tried Tums and an extra Protonix at home which did not help.  Nursing stated that when she got here she looked miserable.  Patient states that she vomited after arrival she has basically been symptom-free since that time.  She states that she did vomit up all the Tums.  No radiation.  Not a regular alcohol user, no drugs, no tobacco, generally tries to limit her diet to reduce the chances of symptoms however is not always able to.   Chest Pain      Home Medications Prior to Admission medications   Medication Sig Start Date End Date Taking? Authorizing Provider  famotidine (PEPCID) 20 MG tablet Take 1 tablet (20 mg total) by mouth 2 (two) times daily. 11/14/22  Yes Eulon Allnutt, Barbara Cower, MD  sucralfate (CARAFATE) 1 GM/10ML suspension Take 10 mLs (1 g total) by mouth 4 (four) times daily -  with meals and at bedtime. 11/14/22  Yes Mckinnon Glick, Barbara Cower, MD  fluticasone (FLONASE) 50 MCG/ACT nasal spray Place 1 spray into both nostrils daily. Begin by using 2 sprays in each nare daily for 3 to 5 days, then decrease to 1 spray in each nare daily. Patient not taking: Reported on 05/30/2022 01/17/22   Theadora Rama Scales, PA-C  ibuprofen (ADVIL) 400 MG tablet Take 1 tablet (400 mg total) by mouth every 8 (eight) hours as needed for  up to 30 doses. Patient not taking: Reported on 07/06/2022 01/17/22   Theadora Rama Scales, PA-C  levothyroxine (SYNTHROID) 112 MCG tablet Take 1 tablet (112 mcg total) by mouth daily before breakfast. Mon - Saturday and 2 tabs on Sunday 07/16/22   Arnette Felts, FNP  pantoprazole (PROTONIX) 40 MG tablet Take 1 tablet (40 mg total) by mouth daily. 07/06/22   Arnette Felts, FNP  sertraline (ZOLOFT) 100 MG tablet Take 1 tablet (100 mg total) by mouth daily. 07/06/22   Arnette Felts, FNP      Allergies    Other    Review of Systems   Review of Systems  Cardiovascular:  Positive for chest pain.    Physical Exam Updated Vital Signs BP 128/85   Pulse 70   Temp 97.8 F (36.6 C) (Temporal)   Resp 18   SpO2 96%  Physical Exam Vitals and nursing note reviewed.  Constitutional:      Appearance: She is well-developed.  HENT:     Head: Normocephalic and atraumatic.  Cardiovascular:     Rate and Rhythm: Normal rate and regular rhythm.  Pulmonary:     Effort: No respiratory distress.     Breath sounds: No stridor.  Abdominal:     General: There is no distension or abdominal bruit.  Musculoskeletal:     Cervical back: Normal range  of motion.  Neurological:     Mental Status: She is alert.     ED Results / Procedures / Treatments   Labs (all labs ordered are listed, but only abnormal results are displayed) Labs Reviewed  BASIC METABOLIC PANEL - Abnormal; Notable for the following components:      Result Value   Glucose, Bld 101 (*)    All other components within normal limits  CBC - Abnormal; Notable for the following components:   RBC 5.26 (*)    MCV 79.3 (*)    All other components within normal limits  PREGNANCY, URINE  TROPONIN I (HIGH SENSITIVITY)    EKG EKG Interpretation Date/Time:  Monday November 14 2022 00:18:24 EDT Ventricular Rate:  78 PR Interval:  174 QRS Duration:  100 QT Interval:  392 QTC Calculation: 446 R Axis:   18  Text Interpretation: Normal sinus  rhythm Cannot rule out Inferior infarct , age undetermined Cannot rule out Anterior infarct (cited on or before 23-May-2021) Abnormal ECG When compared with ECG of 23-May-2021 18:57, Questionable change in initial forces of Anterior leads Confirmed by Marily Memos [phone]) on 11/14/2022 2:10:54 AM  Radiology DG Chest 2 View  Result Date: 11/14/2022 CLINICAL DATA:  Chest pain EXAM: CHEST - 2 VIEW COMPARISON:  None Available. FINDINGS: The heart size and mediastinal contours are within normal limits. Both lungs are clear. The visualized skeletal structures are unremarkable. IMPRESSION: No active cardiopulmonary disease. Electronically Signed   By: Minerva Fester M.D.   On: 11/14/2022 02:47    Procedures Procedures    Medications Ordered in ED Medications  ondansetron (ZOFRAN-ODT) disintegrating tablet 4 mg (4 mg Oral Given 11/14/22 0046)  alum & mag hydroxide-simeth (MAALOX/MYLANTA) 200-200-20 MG/5ML suspension 30 mL (30 mLs Oral Given 11/14/22 0059)    And  lidocaine (XYLOCAINE) 2 % viscous mouth solution 15 mL (15 mLs Oral Given 11/14/22 0059)  famotidine (PEPCID) tablet 20 mg (20 mg Oral Given 11/14/22 0057)  pantoprazole (PROTONIX) EC tablet 40 mg (40 mg Oral Given 11/14/22 0057)    ED Course/ Medical Decision Making/ A&P                                 Medical Decision Making Amount and/or Complexity of Data Reviewed Labs: ordered. Radiology: ordered.  Risk OTC drugs. Prescription drug management.   Patient has been feeling better since being here.  Chest x-ray is good on my interpretation pending radiology read.  Low suspicion for ACS.  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  I also discussed maybe holding her Mounjaro for the time being, I saw that she has already reduced the dose.  Will also add on some new and acid medicines pending GI referral.  Persistent symptoms  requiring imaging or further workup here in the emergency room at this time.  Knows to return with any new or worsening or not improving symptoms.  Final Clinical Impression(s) / ED Diagnoses Final diagnoses:  Indigestion    Rx / DC Orders ED Discharge Orders          Ordered    famotidine (PEPCID) 20 MG tablet  2 times daily        11/14/22 0216    sucralfate (CARAFATE) 1 GM/10ML suspension  3 times daily with meals & bedtime  11/14/22 0216              Dezi Schaner, Barbara Cower, MD 11/14/22 [phone]

## 2022-11-14 NOTE — Telephone Encounter (Signed)
-----   Message ----- From: Shellia Cleverly, DO Sent: 11/14/2022   7:58 AM EDT To: Richardson Chiquito, RN  Patient was seen in the ER over the weekend for what sounds like dyspepsia and reflux.  ER evaluation unrevealing to include normal labs, EKG.  She was treated with GI cocktail, Zofran, Protonix and discharged home.  Can we please call to check in on her and see how she is feeling.  If any ongoing symptoms, can schedule follow-up appointment. Thanks ----- Message ----- From: Marily Memos, MD Sent: 11/14/2022   7:16 AM EDT To: Shellia Cleverly, DO

## 2022-11-14 NOTE — ED Notes (Signed)
Patient reports intense mid-sternal chest pain after she ate today. Patient reports for the last 5 days after she ate meals, she had a "Gerd attack". Patient reports taking pantoprazole daily with out relief. Patient currently denies any pain at this time.

## 2022-11-14 NOTE — ED Triage Notes (Signed)
Pt reports GERD symptoms all day today  Belching, burning, pain in chest.  Has not responded to her medications.  Pt states she got worried it was more to it than GERD.  Has not been able to vomit but has tried.

## 2022-11-14 NOTE — Telephone Encounter (Signed)
I contacted patient who states that she is still feeling ok today, although she has not eaten anything yet. Patient states she was advised by the ER physician to eat small amounts at a time for now. I advised that in addition to this, I would like her to start off eating bland foods, avoiding spicy, citrus foods, fatty foods etc to prevent reflux events. She verbalizes understanding. She has been scheduled to see Boone Master, PA-C for follow up on 12/02/22.

## 2022-11-18 ENCOUNTER — Telehealth: Payer: Self-pay | Admitting: Physician Assistant

## 2022-11-18 ENCOUNTER — Telehealth: Payer: Self-pay

## 2022-11-18 DIAGNOSIS — R1013 Epigastric pain: Secondary | ICD-10-CM

## 2022-11-18 DIAGNOSIS — K760 Fatty (change of) liver, not elsewhere classified: Secondary | ICD-10-CM

## 2022-11-18 MED ORDER — DICYCLOMINE HCL 20 MG PO TABS: 20.0000 mg | ORAL_TABLET | Freq: Three times a day (TID) | ORAL | 0 refills | Status: DC | PRN

## 2022-11-18 MED ORDER — ONDANSETRON HCL 8 MG PO TABS: 8.0000 mg | ORAL_TABLET | Freq: Three times a day (TID) | ORAL | 0 refills | Status: DC | PRN

## 2022-11-18 NOTE — Telephone Encounter (Signed)
Patient was last seen in Jan 2024 Had negative labs, no anemia, no infection, no celiac, negative AB [place] other than fatty liver, s/p cholecystectomy. Given PPI,  If not improving will plan on EGD versus cross sectional imaging  Had not heard back from the patient, she had attack, went to ER They got a CBC that showed no infection, no anemia though low MCV.  They did not get LFTS or imaging.  Will send in dicyclomine and zofran for symptoms, continue PPI BID. Has OV with [facility] on the 25th  Will also get LFTs, labs are in

## 2022-11-18 NOTE — Telephone Encounter (Signed)
Inbound call from patient requesting to speak with a nurse in regards to having trouble swallowing. Please advise.

## 2022-11-18 NOTE — Telephone Encounter (Signed)
Spoke with pt and she states she was in the hospital Sunday with a gerd "attack.". Reports last night she had another attack. Reports she is taking protonix 40mg  in the am and pepcid 20 at night. She is also taking maalox bid. States she was in so much pain last night she tried to make herself vomit as that sometimes helps her feel better. She wound up taking an old oxycodone she had and was able to sleep. She woke up and ate a saltine and took am med and vomited again. States she is having stabbing pains in her stomach and nauseated. Let pt know if she  is not able to drink liquids of keep her meds down she would need to go to the hospital. Pt is asking if she can have a prescription for zofran for the nausea. Please advise.

## 2022-11-18 NOTE — Telephone Encounter (Signed)
Spoke with pt and she is aware of recommendations per Quentin Mulling PA, she knows to come for labs. Pt also aware that 2 prescriptions have been sent in for her.

## 2022-11-18 NOTE — Transitions of Care (Post Inpatient/ED Visit) (Signed)
   11/18/2022  Name: LAURITA PERON MRN: [id number] DOB: [birth year]  Today's TOC FU Call Status: Today's TOC FU Call Status:: Successful TOC FU Call Completed TOC FU Call Complete Date: 11/18/22 Patient's Name and Date of Birth confirmed.  Transition Care Management Follow-up Telephone Call Date of Discharge: 11/14/22 Discharge Facility: [facility] Type of Discharge: Inpatient Admission Primary Inpatient Discharge Diagnosis:: Indigestion How have you been since you were released from the hospital?: Better Any questions or concerns?: No  Items Reviewed: Did you receive and understand the discharge instructions provided?: Yes Medications obtained,verified, and reconciled?: Yes (Medications Reviewed) Any new allergies since your discharge?: No Dietary orders reviewed?: Yes Type of Diet Ordered:: small meals, no spicy and acidic Do you have support at home?: Yes People in Home: spouse  Medications Reviewed Today: Medications Reviewed Today   Medications were not reviewed in this encounter     Home Care and Equipment/Supplies: Were Home Health Services Ordered?: No Any new equipment or medical supplies ordered?: No  Functional Questionnaire: Do you need assistance with bathing/showering or dressing?: No Do you need assistance with meal preparation?: No Do you need assistance with eating?: No Do you have difficulty maintaining continence: No Do you need assistance with getting out of bed/getting out of a chair/moving?: No Do you have difficulty managing or taking your medications?: No  Follow up appointments reviewed: PCP Follow-up appointment confirmed?: No MD Provider Line [phone] Given: Yes Specialist Hospital Follow-up appointment confirmed?: Yes Date of Specialist follow-up appointment?: 12/02/22 Follow-Up Specialty Provider:: GI Do you need transportation to your follow-up appointment?: No Do you understand care options if your  condition(s) worsen?: Yes-patient verbalized understanding    SIGNATURE Lisabeth Devoid, CMA

## 2022-12-02 ENCOUNTER — Ambulatory Visit: Payer: Medicaid Other | Admitting: Gastroenterology

## 2022-12-02 ENCOUNTER — Telehealth: Payer: Self-pay | Admitting: Gastroenterology

## 2022-12-02 NOTE — Telephone Encounter (Signed)
Good Morning Melissa Whitney,   Patient called stating she would not be in this morning for her 10:30 appointment, due to needing to be at the hospital with her father.

## 2023-01-14 ENCOUNTER — Other Ambulatory Visit: Payer: Self-pay | Admitting: Nurse Practitioner

## 2023-01-14 DIAGNOSIS — F3181 Bipolar II disorder: Secondary | ICD-10-CM

## 2023-01-23 ENCOUNTER — Encounter (HOSPITAL_COMMUNITY): Payer: Self-pay

## 2023-01-23 ENCOUNTER — Ambulatory Visit (HOSPITAL_COMMUNITY)
Admission: RE | Admit: 2023-01-23 | Discharge: 2023-01-23 | Disposition: A | Discharge status: Home / Self Care | Payer: Medicaid Other | Source: Ambulatory Visit | Attending: Family Medicine | Admitting: Family Medicine

## 2023-01-23 ENCOUNTER — Other Ambulatory Visit: Payer: Self-pay

## 2023-01-23 VITALS — BP 133/80 | HR 79 | Temp 97.9°F | Resp 18

## 2023-01-23 DIAGNOSIS — H6121 Impacted cerumen, right ear: Secondary | ICD-10-CM

## 2023-01-23 NOTE — ED Triage Notes (Signed)
Complains of right ear pressure and fullness, reports unable to hear anything in this ear.  Patient has used peroxide and debrox in this ear, but no improvement in hearing  Patient reports no wax or anything came out of ear and no improvement in symptoms

## 2023-01-23 NOTE — ED Provider Notes (Signed)
[facility]   [id number] 01/23/23 Arrival Time: 8295  ASSESSMENT & PLAN:  1. Impacted cerumen of right ear    Feels much better after flushing ear. Denies ear pain. No signs of infection. OTC Debrox as needed.   Follow-up Information     Arnette Felts, FNP.   Specialty: General Practice Why: As needed. Contact information: [address] [place] [address] [phone]                 Reviewed expectations re: course of current medical issues. Questions answered. Outlined signs and symptoms indicating need for more acute intervention. Patient verbalized understanding. After Visit Summary given.   SUBJECTIVE: History from: patient. Melissa Whitney is a 42 y.o. female who presents with complaint of right ear pressure and fullness, reports unable to hear anything in this ear.  Patient has used peroxide and debrox in this ear, but no improvement in hearing. Denies fever.   OBJECTIVE:  Vitals:   01/23/23 1039  BP: 133/80  Pulse: 79  Resp: 18  Temp: 97.9 F (36.6 C)  TempSrc: Oral  SpO2: 98%    General appearance: alert; no distress Eyes: PERRLA; EOMI; conjunctiva normal HENT: normocephalic; atraumatic; R EAC with cerumen impaction (TM appears normal after flushing) Neck: supple  Psychological: alert and cooperative; normal mood and affect   Allergies  Allergen Reactions   Other Itching and Swelling    Poppy Seed    Past Medical History:  Diagnosis Date   Depression    Dysmenorrhea    Gallstone    GERD (gastroesophageal reflux disease)    with pregnancy   History of migraine    Hypothyroidism    Mood disorder (HCC)    Neurologic cardiac syncope    no issues since age 19   PCOS (polycystic ovarian syndrome)    Pregnancy induced hypertension    Smoker 10/01/2018   Quit 06/18/2019   Toxic maculopathy    Vaginal Pap smear, abnormal    Social History   Socioeconomic History   Marital status: Married    Spouse  name: Not on file   Number of children: 1   Years of education: Not on file   Highest education level: Not on file  Occupational History   Occupation: self employed  Tobacco Use   Smoking status: Former    Current packs/day: 0.00    Types: Cigarettes    Quit date: 06/2019    Years since quitting: 3.6   Smokeless tobacco: Never  Vaping Use   Vaping status: Never Used  Substance and Sexual Activity   Alcohol use: Not Currently    Comment: 3 years   Drug use: Not Currently   Sexual activity: Yes  Other Topics Concern   Not on file  Social History Narrative   Not on file   Social Drivers of Health   Financial Resource Strain: Not on file  Food Insecurity: No Food Insecurity (03/17/2021)   Hunger Vital Sign    Worried About Running Out of Food in the Last Year: Never true    Ran Out of Food in the Last Year: Never true  Transportation Needs: No Transportation Needs (03/17/2021)   PRAPARE - Administrator, Civil Service (Medical): No    Lack of Transportation (Non-Medical): No  Physical Activity: Not on file  Stress: Not on file  Social Connections: Not on file  Intimate Partner Violence: Not At Risk (03/17/2021)   Humiliation, Afraid, Rape, and Kick questionnaire  Fear of Current or Ex-Partner: No    Emotionally Abused: No    Physically Abused: No    Sexually Abused: No   Family History  Problem Relation Age of Onset   Thyroid disease Mother    Endometriosis Mother    COPD Mother    Heart disease Father    Heart attack Father    Arthritis Sister        psoriatic   Thyroid disease Sister    Endometriosis Sister    Thyroid disease Maternal Grandmother    Heart disease Maternal Grandmother    Esophageal cancer Paternal Grandmother    Past Surgical History:  Procedure Laterality Date   APPENDECTOMY     age 55   CHOLECYSTECTOMY N/A 06/09/2021   Procedure: LAPAROSCOPIC CHOLECYSTECTOMY;  Surgeon: Axel Filler, MD;  Location: [facility];  Service: General;   Laterality: N/A;   COLPOSCOPY     GYNECOLOGIC CRYOSURGERY     cervical   TONSILLECTOMY AND ADENOIDECTOMY     WISDOM TOOTH EXTRACTION       Mardella Layman, MD 01/23/23 1056

## 2023-03-09 ENCOUNTER — Other Ambulatory Visit: Payer: Self-pay

## 2023-03-09 ENCOUNTER — Ambulatory Visit
Admission: RE | Admit: 2023-03-09 | Discharge: 2023-03-09 | Disposition: A | Discharge status: Home / Self Care | Payer: Medicaid Other | Source: Ambulatory Visit | Attending: Family Medicine | Admitting: Family Medicine

## 2023-03-09 VITALS — BP 133/89 | HR 75 | Temp 97.8°F

## 2023-03-09 DIAGNOSIS — J209 Acute bronchitis, unspecified: Secondary | ICD-10-CM | POA: Diagnosis not present

## 2023-03-09 DIAGNOSIS — J0100 Acute maxillary sinusitis, unspecified: Secondary | ICD-10-CM | POA: Diagnosis not present

## 2023-03-09 LAB — POCT RAPID STREP A (OFFICE): Rapid Strep A Screen: NEGATIVE

## 2023-03-09 MED ORDER — FLUTICASONE PROPIONATE 50 MCG/ACT NA SUSP
1.0000 | Freq: Every day | NASAL | 0 refills | Status: DC
Start: 2023-03-09 — End: 2023-07-14

## 2023-03-09 MED ORDER — PROMETHAZINE-DM 6.25-15 MG/5ML PO SYRP
5.0000 mL | ORAL_SOLUTION | Freq: Four times a day (QID) | ORAL | 0 refills | Status: DC | PRN
Start: 2023-03-09 — End: 2023-07-14

## 2023-03-09 MED ORDER — AMOXICILLIN-POT CLAVULANATE 875-125 MG PO TABS
1.0000 | ORAL_TABLET | Freq: Two times a day (BID) | ORAL | 0 refills | Status: DC
Start: 2023-03-09 — End: 2023-07-14

## 2023-03-09 NOTE — Discharge Instructions (Addendum)
Start Augmentin twice daily for 7 days.  Use Flonase daily.  Nasal rinses as tolerated.  Promethazine DM as needed for cough.  Please note this medication can make you drowsy.  Do not drink alcohol or drive while on this medication.  Lots of rest and fluids.  Please follow-up with your PCP if your symptoms do not improve.  Please go to the ER for any worsening symptoms.  I hope you feel better soon!

## 2023-03-09 NOTE — ED Provider Notes (Signed)
[facility]    CSN: [id number] Arrival date & time: 03/09/23  6213      History   Chief Complaint No chief complaint on file.   HPI Melissa Whitney is a 43 y.o. female  presents for evaluation of URI symptoms for 7-8 days. Patient reports associated symptoms of cough, congestion, sinus pressure/pain with purulent nasal discharge, body aches, sore throat, low-grade fevers. Denies N/V/D, ear pain, shortness of breath. Patient does not have a hx of asthma. Patient is not an active smoker.   Reports daughter had similar symptoms.  Pt has taken Mucinex OTC for symptoms. Pt has no other concerns at this time.   HPI  Past Medical History:  Diagnosis Date   Depression    Dysmenorrhea    Gallstone    GERD (gastroesophageal reflux disease)    with pregnancy   History of migraine    Hypothyroidism    Mood disorder (HCC)    Neurologic cardiac syncope    no issues since age 3   PCOS (polycystic ovarian syndrome)    Pregnancy induced hypertension    Smoker 10/01/2018   Quit 06/18/2019   Toxic maculopathy    Vaginal Pap smear, abnormal     Patient Active Problem List   Diagnosis Date Noted   Dyspepsia 07/16/2022   Bloating 07/16/2022   Recurrent major depressive disorder, in partial remission (HCC) 07/06/2022   Gestational hypertension 04/04/2021   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   Bipolar II disorder (HCC) 03/04/2021   Hypothyroid 10/01/2018    Past Surgical History:  Procedure Laterality Date   APPENDECTOMY     age 71   CHOLECYSTECTOMY N/A 06/09/2021   Procedure: LAPAROSCOPIC CHOLECYSTECTOMY;  Surgeon: Axel Filler, MD;  Location: [facility];  Service: General;  Laterality: N/A;   COLPOSCOPY     GYNECOLOGIC CRYOSURGERY     cervical   TONSILLECTOMY AND ADENOIDECTOMY     WISDOM TOOTH EXTRACTION      OB History     Gravida  1   Para  1   Term  1   Preterm  0   AB  0   Living  1      SAB  0   IAB  0   Ectopic  0   Multiple  0   Live  Births  1            Home Medications    Prior to Admission medications   Medication Sig Start Date End Date Taking? Authorizing Provider  amoxicillin-clavulanate (AUGMENTIN) 875-125 MG tablet Take 1 tablet by mouth every 12 (twelve) hours. 03/09/23  Yes Radford Pax, NP  fluticasone (FLONASE) 50 MCG/ACT nasal spray Place 1 spray into both nostrils daily. 03/09/23  Yes Radford Pax, NP  promethazine-dextromethorphan (PROMETHAZINE-DM) 6.25-15 MG/5ML syrup Take 5 mLs by mouth 4 (four) times daily as needed for cough. 03/09/23  Yes Radford Pax, NP  dicyclomine (BENTYL) 20 MG tablet Take 1 tablet (20 mg total) by mouth 3 (three) times daily as needed for spasms. 11/18/22   Doree Albee, PA-C  famotidine (PEPCID) 20 MG tablet Take 1 tablet (20 mg total) by mouth 2 (two) times daily. 11/14/22   Mesner, Barbara Cower, MD  ibuprofen (ADVIL) 400 MG tablet Take 1 tablet (400 mg total) by mouth every 8 (eight) hours as needed for up to 30 doses. Patient not taking: Reported on 07/06/2022 01/17/22   Theadora Rama Scales, PA-C  levothyroxine (SYNTHROID) 112 MCG tablet Take 1  tablet (112 mcg total) by mouth daily before breakfast. Mon - Saturday and 2 tabs on Sunday 07/16/22   Arnette Felts, FNP  ondansetron (ZOFRAN) 8 MG tablet Take 1 tablet (8 mg total) by mouth every 8 (eight) hours as needed for nausea or vomiting. 11/18/22   Doree Albee, PA-C  pantoprazole (PROTONIX) 40 MG tablet Take 1 tablet (40 mg total) by mouth daily. 07/06/22   Arnette Felts, FNP  sertraline (ZOLOFT) 100 MG tablet Take 1 tablet (100 mg total) by mouth daily. 07/06/22   Arnette Felts, FNP  sucralfate (CARAFATE) 1 GM/10ML suspension Take 10 mLs (1 g total) by mouth 4 (four) times daily -  with meals and at bedtime. 11/14/22   Mesner, Barbara Cower, MD    Family History Family History  Problem Relation Age of Onset   Thyroid disease Mother    Endometriosis Mother    COPD Mother    Heart disease Father    Heart attack Father     Arthritis Sister        psoriatic   Thyroid disease Sister    Endometriosis Sister    Thyroid disease Maternal Grandmother    Heart disease Maternal Grandmother    Esophageal cancer Paternal Grandmother     Social History Social History   Tobacco Use   Smoking status: Former    Current packs/day: 0.00    Types: Cigarettes    Quit date: 06/2019    Years since quitting: 3.7   Smokeless tobacco: Never  Vaping Use   Vaping status: Never Used  Substance Use Topics   Alcohol use: Not Currently    Comment: 3 years   Drug use: Not Currently     Allergies   Other   Review of Systems Review of Systems  Constitutional:  Positive for chills and fever.  HENT:  Positive for congestion, sinus pressure, sinus pain and sore throat.   Respiratory:  Positive for cough.      Physical Exam Triage Vital Signs ED Triage Vitals  Encounter Vitals Group     BP 03/09/23 1004 133/89     Systolic BP Percentile --      Diastolic BP Percentile --      Pulse Rate 03/09/23 1004 75     Resp --      Temp 03/09/23 1004 97.8 F (36.6 C)     Temp src --      SpO2 03/09/23 1004 97 %     Weight --      Height --      Head Circumference --      Peak Flow --      Pain Score 03/09/23 1006 7     Pain Loc --      Pain Education --      Exclude from Growth Chart --    No data found.  Updated Vital Signs BP 133/89 (BP Location: Left Arm)   Pulse 75   Temp 97.8 F (36.6 C)   LMP 03/04/2023 (Exact Date)   SpO2 97%   Visual Acuity Right Eye Distance:   Left Eye Distance:   Bilateral Distance:    Right Eye Near:   Left Eye Near:    Bilateral Near:     Physical Exam Vitals and nursing note reviewed.  Constitutional:      General: She is not in acute distress.    Appearance: She is well-developed. She is not ill-appearing.  HENT:     Head: Normocephalic and atraumatic.  Right Ear: Tympanic membrane and ear canal normal.     Left Ear: Tympanic membrane and ear canal normal.      Nose: Congestion present.     Right Turbinates: Swollen and pale.     Left Turbinates: Swollen and pale.     Right Sinus: Maxillary sinus tenderness present.     Left Sinus: Maxillary sinus tenderness present.     Mouth/Throat:     Mouth: Mucous membranes are moist.     Pharynx: Oropharynx is clear. Uvula midline. Posterior oropharyngeal erythema present.     Tonsils: No tonsillar exudate or tonsillar abscesses.  Eyes:     Conjunctiva/sclera: Conjunctivae normal.     Pupils: Pupils are equal, round, and reactive to light.  Cardiovascular:     Rate and Rhythm: Normal rate and regular rhythm.     Heart sounds: Normal heart sounds.  Pulmonary:     Effort: Pulmonary effort is normal.     Breath sounds: Normal breath sounds.  Musculoskeletal:     Cervical back: Normal range of motion and neck supple.  Lymphadenopathy:     Cervical: No cervical adenopathy.  Skin:    General: Skin is warm and dry.  Neurological:     General: No focal deficit present.     Mental Status: She is alert and oriented to person, place, and time.  Psychiatric:        Mood and Affect: Mood normal.        Behavior: Behavior normal.      UC Treatments / Results  Labs (all labs ordered are listed, but only abnormal results are displayed) Labs Reviewed  POCT RAPID STREP A (OFFICE)    EKG   Radiology No results found.  Procedures Procedures (including critical care time)  Medications Ordered in UC Medications - No data to display  Initial Impression / Assessment and Plan / UC Course  I have reviewed the triage vital signs and the nursing notes.  Pertinent labs & imaging results that were available during my care of the patient were reviewed by me and considered in my medical decision making (see chart for details).     Reviewed exam and symptoms with patient.  No red flags.  Negative rapid strep.  Discussed sinusitis/bronchitis.  Given length of symptoms start Augmentin.  Flonase daily.   Promethazine DM as needed for cough, side effect profile reviewed.  Advise rest and fluids.  PCP follow-up if symptoms do not improve.  ER precautions reviewed. Final Clinical Impressions(s) / UC Diagnoses   Final diagnoses:  Acute maxillary sinusitis, recurrence not specified  Acute bronchitis, unspecified organism     Discharge Instructions      Start Augmentin twice daily for 7 days.  Use Flonase daily.  Nasal rinses as tolerated.  Promethazine DM as needed for cough.  Please note this medication can make you drowsy.  Do not drink alcohol or drive while on this medication.  Lots of rest and fluids.  Please follow-up with your PCP if your symptoms do not improve.  Please go to the ER for any worsening symptoms.  I hope you feel better soon!    ED Prescriptions     Medication Sig Dispense Auth. Provider   amoxicillin-clavulanate (AUGMENTIN) 875-125 MG tablet Take 1 tablet by mouth every 12 (twelve) hours. 14 tablet Radford Pax, NP   fluticasone (FLONASE) 50 MCG/ACT nasal spray Place 1 spray into both nostrils daily. 15.8 mL Radford Pax, NP   promethazine-dextromethorphan (PROMETHAZINE-DM) 6.25-15  MG/5ML syrup Take 5 mLs by mouth 4 (four) times daily as needed for cough. 118 mL Radford Pax, NP      PDMP not reviewed this encounter.   Radford Pax, NP 03/09/23 [phone]

## 2023-03-09 NOTE — ED Triage Notes (Signed)
C/O non productive cough, body aches, chill, sore throat. Symptoms x one week. Patient took Mucinex last night with relief. C/O nasal drainage. Patient sore throat is severe. Patient states negative home covid test.

## 2023-04-18 ENCOUNTER — Encounter: Payer: Self-pay | Admitting: Obstetrics and Gynecology

## 2023-04-18 NOTE — Progress Notes (Signed)
 Patient is requesting a referral for infertility.

## 2023-04-20 ENCOUNTER — Other Ambulatory Visit: Payer: Self-pay | Admitting: Obstetrics and Gynecology

## 2023-04-20 DIAGNOSIS — N970 Female infertility associated with anovulation: Secondary | ICD-10-CM

## 2023-04-20 MED ORDER — LETROZOLE 2.5 MG PO TABS
2.5000 mg | ORAL_TABLET | Freq: Every day | ORAL | 2 refills | Status: DC
Start: 2023-04-20 — End: 2023-07-14

## 2023-04-20 MED ORDER — MEDROXYPROGESTERONE ACETATE 10 MG PO TABS
10.0000 mg | ORAL_TABLET | Freq: Every day | ORAL | 2 refills | Status: DC
Start: 2023-04-20 — End: 2023-07-14

## 2023-05-29 ENCOUNTER — Ambulatory Visit

## 2023-05-30 ENCOUNTER — Encounter: Payer: Self-pay | Admitting: Obstetrics and Gynecology

## 2023-06-10 NOTE — Progress Notes (Signed)
 Patient's urine was not found.    Pranshu Lyster, RN

## 2023-07-09 DIAGNOSIS — O034 Incomplete spontaneous abortion without complication: Secondary | ICD-10-CM

## 2023-07-09 HISTORY — DX: Incomplete spontaneous abortion without complication: O03.4

## 2023-07-14 ENCOUNTER — Ambulatory Visit (INDEPENDENT_AMBULATORY_CARE_PROVIDER_SITE_OTHER): Payer: Self-pay

## 2023-07-14 DIAGNOSIS — Z3201 Encounter for pregnancy test, result positive: Secondary | ICD-10-CM

## 2023-07-14 LAB — POCT PREGNANCY, URINE: Preg Test, Ur: POSITIVE — AB

## 2023-07-14 NOTE — Patient Instructions (Signed)
[facility] Prenatal Care Providers          [facility] locations:  Hours may vary. Please call for an appointment  [facility] at [facility]             [address], [place], [place] [address] [phone]  [facility] at [facility]                                                             [address], [address], [place], [place], [address] [phone]  [facility] at [facility]                                    [address], [address], [place], [place], [address] [phone]  [facility] at [place] [address], [address], [place], [place], [address] [phone]  [facility] at [facility]                                 [address], [place], [place], [address] [phone]  [facility] at [facility]                                    [address], [place], [place], [address] [phone]  [facility] at [facility] [address], [address], [place], [place], [address]                                                   Safe Medications in Pregnancy    Acne: Benzoyl Peroxide Salicylic Acid  Backache/Headache: Tylenol: 2 regular strength every 4 hours OR              2 Extra strength every 6 hours  Colds/Coughs/Allergies: Benadryl (alcohol free) 25 mg every 6 hours as needed Breath right strips Claritin Cepacol throat lozenges Chloraseptic throat spray Cold-Eeze- up to three times per day Cough drops, alcohol free Flonase (by prescription only) Guaifenesin Mucinex Robitussin DM (plain only, alcohol free) Saline nasal spray/drops Sudafed (pseudoephedrine) & Actifed ** use only after [redacted] weeks gestation and if you do not have high blood pressure Tylenol Vicks Vaporub Zinc lozenges Zyrtec   Constipation: Colace Ducolax suppositories Fleet enema Glycerin  suppositories Metamucil Milk of magnesia Miralax Senokot Smooth move tea  Diarrhea: Kaopectate Imodium A-D  *NO pepto Bismol  Hemorrhoids: Anusol Anusol HC Preparation H Tucks  Indigestion: Tums Maalox Mylanta Zantac  Pepcid  Insomnia: Benadryl (alcohol free) 25mg every 6 hours as needed Tylenol PM Unisom, no Gelcaps  Leg Cramps: Tums MagGel  Nausea/Vomiting:  Bonine Dramamine Emetrol Ginger extract Sea bands Meclizine  Nausea medication to take during pregnancy:  Unisom (doxylamine succinate 25 mg tablets) Take one tablet daily at bedtime. If symptoms are not adequately controlled, the dose can be increased to a maximum recommended dose of two   tablets daily (1/2 tablet in the morning, 1/2 tablet mid-afternoon and one at bedtime). Vitamin B6 100mg tablets. Take one tablet twice a day (up to 200 mg per day).  Skin Rashes: Aveeno products Benadryl cream or 25mg every 6 hours as needed Calamine Lotion 1% cortisone cream  Yeast infection: Gyne-lotrimin 7 Monistat 7   **If taking multiple medications, please check labels to avoid duplicating the same active ingredients **take medication as directed on the label ** Do not exceed 4000 mg of tylenol in 24 hours **Do not take medications that contain aspirin or ibuprofen    

## 2023-07-14 NOTE — Progress Notes (Signed)
 Pt left urine for UPT resulting positive.  Pt reports LMP 04/29/23 which makes EDD 02/03/24 and 10w 6d today.   Pt denies VB and pain.  Medications and allergies reviewed.  Pt provided with OB providers and medications safe to take in pregnancy.  Pt advised when to go to MAU for evaluation.   Pt verbalized understanding with no further questions.   Shemica Meath,RN  07/14/23

## 2023-07-16 ENCOUNTER — Encounter (HOSPITAL_BASED_OUTPATIENT_CLINIC_OR_DEPARTMENT_OTHER): Payer: Self-pay | Admitting: Emergency Medicine

## 2023-07-16 ENCOUNTER — Emergency Department (HOSPITAL_BASED_OUTPATIENT_CLINIC_OR_DEPARTMENT_OTHER)
Admission: EM | Admit: 2023-07-16 | Discharge: 2023-07-16 | Disposition: A | Discharge status: Home / Self Care | Payer: Self-pay | Attending: Emergency Medicine | Admitting: Emergency Medicine

## 2023-07-16 ENCOUNTER — Other Ambulatory Visit: Payer: Self-pay

## 2023-07-16 ENCOUNTER — Emergency Department (HOSPITAL_BASED_OUTPATIENT_CLINIC_OR_DEPARTMENT_OTHER): Payer: Self-pay

## 2023-07-16 DIAGNOSIS — O209 Hemorrhage in early pregnancy, unspecified: Secondary | ICD-10-CM

## 2023-07-16 DIAGNOSIS — O200 Threatened abortion: Secondary | ICD-10-CM | POA: Insufficient documentation

## 2023-07-16 DIAGNOSIS — O208 Other hemorrhage in early pregnancy: Secondary | ICD-10-CM | POA: Insufficient documentation

## 2023-07-16 LAB — BASIC METABOLIC PANEL WITH GFR
Anion gap: 13 (ref 5–15)
BUN: 10 mg/dL (ref 6–20)
CO2: 23 mmol/L (ref 22–32)
Calcium: 9.4 mg/dL (ref 8.9–10.3)
Chloride: 102 mmol/L (ref 98–111)
Creatinine, Ser: 0.78 mg/dL (ref 0.44–1.00)
GFR, Estimated: >60 mL/min (ref >60)
Glucose, Bld: 94 mg/dL (ref 70–99)
Potassium: 3.9 mmol/L (ref 3.5–5.1)
Sodium: 138 mmol/L (ref 135–145)

## 2023-07-16 LAB — CBC WITH DIFFERENTIAL/PLATELET
Abs Immature Granulocytes: 0.03 10*3/uL (ref 0.00–0.07)
Basophils Absolute: 0.1 10*3/uL (ref 0.0–0.1)
Basophils Relative: 1 %
Eosinophils Absolute: 0.1 10*3/uL (ref 0.0–0.5)
Eosinophils Relative: 1 %
HCT: 40.7 % (ref 36.0–46.0)
Hemoglobin: 13.6 g/dL (ref 12.0–15.0)
Immature Granulocytes: 0 %
Lymphocytes Relative: 35 %
Lymphs Abs: 3.1 10*3/uL (ref 0.7–4.0)
MCH: 26.8 pg (ref 26.0–34.0)
MCHC: 33.4 g/dL (ref 30.0–36.0)
MCV: 80.3 fL (ref 80.0–100.0)
Monocytes Absolute: 0.5 10*3/uL (ref 0.1–1.0)
Monocytes Relative: 6 %
Neutro Abs: 5 10*3/uL (ref 1.7–7.7)
Neutrophils Relative %: 57 %
Platelets: 318 10*3/uL (ref 150–400)
RBC: 5.07 MIL/uL (ref 3.87–5.11)
RDW: 13.6 % (ref 11.5–15.5)
WBC: 8.8 10*3/uL (ref 4.0–10.5)
nRBC: 0 % (ref 0.0–0.2)

## 2023-07-16 LAB — URINALYSIS, ROUTINE W REFLEX MICROSCOPIC
Bilirubin Urine: NEGATIVE
Glucose, UA: NEGATIVE mg/dL
Ketones, ur: NEGATIVE mg/dL
Leukocytes,Ua: NEGATIVE
Nitrite: NEGATIVE
Protein, ur: NEGATIVE mg/dL
Specific Gravity, Urine: 1.024 (ref 1.005–1.030)
pH: 6 (ref 5.0–8.0)

## 2023-07-16 LAB — HCG, QUANTITATIVE, PREGNANCY: hCG, Beta Chain, Quant, S: 5737 m[IU]/mL — ABNORMAL HIGH (ref <5)

## 2023-07-16 NOTE — ED Triage Notes (Signed)
 11 weeks preg. 2nd preg. HX preeclampsia in previous preg. Comes in to the ER today for one instance of bleeding. Describes as about one tbsp of blood and notes some pink tinge in urine last night. Denies back pains, cramping.

## 2023-07-16 NOTE — ED Provider Notes (Signed)
 [facility] AT [facility] Provider Note   CSN: [id number] Arrival date & time: 07/16/23  1243     History  No chief complaint on file.   Melissa Whitney is a 43 y.o. female.  Patient with recent diagnosis of pregnancy, today is at 11 weeks and 1 day per dates --presents to the emergency department for evaluation of vaginal bleeding.  Patient states that earlier today she began to have a small amount of vaginal bleeding.  She describes this as "less than a teaspoon".  She denies associated pain.  No abdominal pain, back pain, pelvic pain or cramping.  She has not yet had an ultrasound.  Had preeclampsia with 1 previous pregnancy.  No vomiting.  No treatments prior to arrival.       Home Medications Prior to Admission medications   Medication Sig Start Date End Date Taking? Authorizing Provider  levothyroxine  (SYNTHROID ) 112 MCG tablet Take 1 tablet (112 mcg total) by mouth daily before breakfast. Mon - Saturday and 2 tabs on Sunday 07/16/22   Susanna Epley, FNP  sertraline  (ZOLOFT ) 100 MG tablet Take 1 tablet (100 mg total) by mouth daily. 07/06/22   Susanna Epley, FNP      Allergies    Other    Review of Systems   Review of Systems  Physical Exam Updated Vital Signs BP (!) 153/90   Pulse 81   Temp 97.6 F (36.4 C)   Resp 17   SpO2 100%  Physical Exam Vitals and nursing note reviewed.  Constitutional:      Appearance: She is well-developed.  HENT:     Head: Normocephalic and atraumatic.  Eyes:     Conjunctiva/sclera: Conjunctivae normal.  Cardiovascular:     Rate and Rhythm: Normal rate and regular rhythm.  Pulmonary:     Effort: No respiratory distress.  Abdominal:     Tenderness: There is no abdominal tenderness. There is no guarding or rebound.  Musculoskeletal:     Cervical back: Normal range of motion and neck supple.  Skin:    General: Skin is warm and dry.  Neurological:     Mental Status: She is alert.     ED Results /  Procedures / Treatments   Labs (all labs ordered are listed, but only abnormal results are displayed) Labs Reviewed  HCG, QUANTITATIVE, PREGNANCY - Abnormal; Notable for the following components:      Result Value   hCG, Beta Chain, Quant, S 5,737 (*)    All other components within normal limits  URINALYSIS, ROUTINE W REFLEX MICROSCOPIC - Abnormal; Notable for the following components:   Hgb urine dipstick LARGE (*)    Bacteria, UA RARE (*)    All other components within normal limits  CBC WITH DIFFERENTIAL/PLATELET  BASIC METABOLIC PANEL WITH GFR    EKG None  Radiology US  OB LESS THAN 14 WEEKS WITH OB TRANSVAGINAL Result Date: 07/16/2023 CLINICAL DATA:  Vaginal bleeding. LMP: 04/29/2023 corresponding to an estimated gestational age of [redacted] weeks, 1 day. EXAM: OBSTETRIC <14 WK US  AND TRANSVAGINAL OB US  TECHNIQUE: Both transabdominal and transvaginal ultrasound examinations were performed for complete evaluation of the gestation as well as the maternal uterus, adnexal regions, and pelvic cul-de-sac. Transvaginal technique was performed to assess early pregnancy. COMPARISON:  None Available. FINDINGS: Intrauterine gestational sac: Irregular and elongated gestational sac in the upper endometrium. Yolk sac:  Seen Embryo:  Not present Cardiac Activity: N/A MSD: 20 mm   6 w   6  d Subchorionic hemorrhage:  Moderate subchorionic hemorrhage. Maternal uterus/adnexae: The maternal ovaries are unremarkable. IMPRESSION: Irregular gestational sac with absent fetal pole. Findings suspicious but not diagnostic for a failed early pregnancy at this time. Clinical correlation and close follow-up recommended. Electronically Signed   By: Angus Bark M.D.   On: 07/16/2023 15:15    Procedures Procedures    Medications Ordered in ED Medications - No data to display  ED Course/ Medical Decision Making/ A&P    Patient seen and examined. History obtained directly from patient and family member at bedside.   Reviewed recent OB/GYN notes.  Labs/EKG: Ordered CBC, BMP, quantitative hCG, UA  Imaging: Ordered ultrasound  Medications/Fluids: None ordered  Most recent vital signs reviewed and are as follows: BP (!) 153/90   Pulse 81   Temp 97.6 F (36.4 C)   Resp 17   SpO2 100%   Initial impression: Vaginal bleeding in early pregnancy, first trimester, will need to confirm intrauterine pregnancy and evaluate for possible causes of bleeding.  Patient is otherwise asymptomatic without pain.  4:19 PM Reassessment performed. Patient appears upset but stable.  Husband and family member at bedside.  I discussed the case with Debbe Fail MAU provider at [facility].  In this case, patient would be scheduled for viability ultrasound in 1 week.  They have been kind enough to assist with scheduling an ultrasound for follow-up on 07/24/2023 at 1:45 PM.  Patient is aware of this appointment.  Labs personally reviewed and interpreted including: CBC is normal with normal white blood cell count and hemoglobin; BMP is normal; quantitative hCG at 5737.  Imaging results reviewed: Ultrasound results as above with concern for failed pregnancy but not diagnostic, moderate subchorionic hemorrhage.  Reviewed pertinent lab work and imaging with patient and family at bedside. Questions answered.   Most current vital signs reviewed and are as follows: BP 118/80   Pulse 78   Temp 97.6 F (36.4 C)   Resp 20   LMP 04/29/2023   SpO2 100%   Plan: Discharge to home.   Prescriptions written for: None  Other home care instructions discussed: Discussed that if miscarrying she can develop heavier vaginal bleeding and pain during this process.  ED return instructions discussed: Encouraged return to the MAU with any worsening symptoms or concerns.    Follow-up instructions discussed: Follow-up for scheduled ultrasound as above.                                 Medical Decision Making Amount and/or Complexity  of Data Reviewed Labs: ordered. Radiology: ordered.   Patient with mild vaginal bleeding in early pregnancy.  She is hemodynamically stable.  Ultrasound today with IUP, no fetal cardiac activity, ultrasound suspicious for failed pregnancy but not diagnostic.  The size of the pregnancy and quantitative hCG would be less than what we expected based on her last menstrual period.  Follow-up as above.        Final Clinical Impression(s) / ED Diagnoses Final diagnoses:  Threatened miscarriage  Vaginal bleeding affecting early pregnancy  Subchorionic hemorrhage in first trimester    Rx / DC Orders ED Discharge Orders     None         Lyna Sandhoff, PA-C 07/16/23 1622    Lowery Rue, DO 07/17/23 0133

## 2023-07-16 NOTE — Discharge Instructions (Addendum)
 Your ultrasound shows a pregnancy at approximately 6 weeks and 6 days based on the size.  Unfortunately, no fetal cardiac activity at this time.  This suggests, but is not diagnostic for, failed pregnancy or impending miscarriage.  As we discussed you will need to follow-up for an ultrasound on 07/24/2023 to determine viability.  Please arrive by 1:45 PM to check in for appointment at 1:55 PM.  If you arrive late, there is a possibility they will cancel the appointment, so please be on time.  If you have any difficulties in the interim including severe uncontrolled pain, heavy vaginal bleeding --please go to the [facility] to be evaluated.

## 2023-07-17 ENCOUNTER — Encounter: Payer: Self-pay | Admitting: Obstetrics and Gynecology

## 2023-07-18 ENCOUNTER — Inpatient Hospital Stay (HOSPITAL_COMMUNITY)
Admission: AD | Admit: 2023-07-18 | Discharge: 2023-07-18 | Disposition: A | Discharge status: Home / Self Care | Payer: Self-pay | Attending: Obstetrics and Gynecology | Admitting: Obstetrics and Gynecology

## 2023-07-18 ENCOUNTER — Inpatient Hospital Stay (HOSPITAL_COMMUNITY): Payer: Self-pay

## 2023-07-18 ENCOUNTER — Encounter (HOSPITAL_COMMUNITY): Payer: Self-pay | Admitting: *Deleted

## 2023-07-18 DIAGNOSIS — O039 Complete or unspecified spontaneous abortion without complication: Secondary | ICD-10-CM | POA: Diagnosis not present

## 2023-07-18 DIAGNOSIS — Z3A11 11 weeks gestation of pregnancy: Secondary | ICD-10-CM

## 2023-07-18 DIAGNOSIS — R109 Unspecified abdominal pain: Secondary | ICD-10-CM | POA: Diagnosis present

## 2023-07-18 DIAGNOSIS — N939 Abnormal uterine and vaginal bleeding, unspecified: Secondary | ICD-10-CM | POA: Insufficient documentation

## 2023-07-18 DIAGNOSIS — Z113 Encounter for screening for infections with a predominantly sexual mode of transmission: Secondary | ICD-10-CM | POA: Diagnosis present

## 2023-07-18 DIAGNOSIS — O209 Hemorrhage in early pregnancy, unspecified: Secondary | ICD-10-CM | POA: Diagnosis present

## 2023-07-18 LAB — TYPE AND SCREEN
ABO/RH(D): A POS
Antibody Screen: NEGATIVE

## 2023-07-18 LAB — CBC
HCT: 38.7 % (ref 36.0–46.0)
Hemoglobin: 12.9 g/dL (ref 12.0–15.0)
MCH: 27 pg (ref 26.0–34.0)
MCHC: 33.3 g/dL (ref 30.0–36.0)
MCV: 81 fL (ref 80.0–100.0)
Platelets: 315 10*3/uL (ref 150–400)
RBC: 4.78 MIL/uL (ref 3.87–5.11)
RDW: 13.6 % (ref 11.5–15.5)
WBC: 9.6 10*3/uL (ref 4.0–10.5)
nRBC: 0 % (ref 0.0–0.2)

## 2023-07-18 LAB — HCG, QUANTITATIVE, PREGNANCY: hCG, Beta Chain, Quant, S: 4865 m[IU]/mL — ABNORMAL HIGH (ref <5)

## 2023-07-18 MED ORDER — OXYCODONE HCL 5 MG PO TABS
10.0000 mg | ORAL_TABLET | ORAL | Status: AC
  Administered 2023-07-18: 10 mg via ORAL
  Filled 2023-07-18: qty 2

## 2023-07-18 MED ORDER — IBUPROFEN 800 MG PO TABS: 800.0000 mg | ORAL_TABLET | Freq: Three times a day (TID) | ORAL | 0 refills | Status: DC

## 2023-07-18 MED ORDER — NALOXONE HCL 4 MG/0.1ML NA LIQD: NASAL | 0 refills | Status: DC

## 2023-07-18 MED ORDER — LACTATED RINGERS IV BOLUS
1000.0000 mL | Freq: Once | INTRAVENOUS | Status: AC
  Administered 2023-07-18: 1000 mL via INTRAVENOUS

## 2023-07-18 MED ORDER — KETOROLAC TROMETHAMINE 30 MG/ML IJ SOLN
30.0000 mg | Freq: Once | INTRAMUSCULAR | Status: AC
  Administered 2023-07-18: 30 mg via INTRAVENOUS
  Filled 2023-07-18: qty 1

## 2023-07-18 MED ORDER — OXYCODONE HCL 5 MG PO TABS: 5.0000 mg | ORAL_TABLET | Freq: Four times a day (QID) | ORAL | 0 refills | Status: DC | PRN

## 2023-07-18 NOTE — MAU Provider Note (Signed)
 History     CSN: [id number]  Arrival date and time: 07/18/23 2130   Event Date/Time   First Provider Initiated Contact with Patient 07/18/2023  3:12 AM   Chief Complaint  Patient presents with   Abdominal Pain   Vaginal Bleeding    HPI  Melissa Whitney is a 43 y.o. G2P1001 at [redacted]w[redacted]d who presents to the MAU for vaginal bleeding. Pt seen on 6/8 for vaginal bleeding, at the time was evaluated for VB in early pregnancy. U/S showed irreg GS w absent FP, mod SCH, c/f failed early pregnancy. She was advised of results and was expectantly managing sxs for SAB. Reports increased pain and VB today, which prompted visit to the MAU. Patient visibly uncomfortable and upset while discussing likely SAB. This was a desired pregnancy. Her and her partner's understanding of results was that there was a baby without a heartbeat and results were conclusive for miscarriage.  Past Medical History:  Diagnosis Date   Depression    Dysmenorrhea    Gallstone    GERD (gastroesophageal reflux disease)    with pregnancy   History of migraine    Hypothyroidism    Mood disorder (HCC)    Neurologic cardiac syncope    no issues since age 28   PCOS (polycystic ovarian syndrome)    Pregnancy induced hypertension    Smoker 10/01/2018   Quit 06/18/2019   Toxic maculopathy    Vaginal Pap smear, abnormal     Past Surgical History:  Procedure Laterality Date   APPENDECTOMY     age 66   CHOLECYSTECTOMY N/A 06/09/2021   Procedure: LAPAROSCOPIC CHOLECYSTECTOMY;  Surgeon: Shela Derby, MD;  Location: [facility];  Service: General;  Laterality: N/A;   COLPOSCOPY     GYNECOLOGIC CRYOSURGERY     cervical   TONSILLECTOMY AND ADENOIDECTOMY     WISDOM TOOTH EXTRACTION      Family History  Problem Relation Age of Onset   Thyroid disease Mother    Endometriosis Mother    COPD Mother    Heart disease Father    Heart attack Father    Arthritis Sister        psoriatic   Thyroid disease Sister     Endometriosis Sister    Thyroid disease Maternal Grandmother    Heart disease Maternal Grandmother    Esophageal cancer Paternal Grandmother     Social History   Tobacco Use   Smoking status: Former    Current packs/day: 0.00    Types: Cigarettes    Quit date: 06/2019    Years since quitting: 4.1   Smokeless tobacco: Never  Vaping Use   Vaping status: Never Used  Substance Use Topics   Alcohol use: Not Currently    Comment: 3 years   Drug use: Not Currently    Allergies:  Allergies  Allergen Reactions   Other Itching and Swelling    Poppy Seed    No medications prior to admission.    ROS reviewed and pertinent positives and negatives as documented in HPI.  Physical Exam   Blood pressure 129/76, pulse 71, temperature 98.2 F (36.8 C), resp. rate 16, weight 97.3 kg, last menstrual period 04/29/2023, SpO2 100%.  Physical Exam Constitutional:      General: She is not in acute distress.    Appearance: Normal appearance. She is not ill-appearing.     Comments: tearful  HENT:     Head: Normocephalic and atraumatic.  Cardiovascular:     Rate and  Rhythm: Normal rate.  Pulmonary:     Effort: Pulmonary effort is normal.     Breath sounds: Normal breath sounds.  Abdominal:     Palpations: Abdomen is soft.     Tenderness: There is no abdominal tenderness. There is no guarding.  Musculoskeletal:        General: Normal range of motion.  Skin:    General: Skin is warm and dry.     Findings: No rash.  Neurological:     General: No focal deficit present.     Mental Status: She is alert and oriented to person, place, and time.     MAU Course  Procedures  MDM 43 y.o. G2P1001 at [redacted]w[redacted]d presenting for vaginal bleeding in early pregnancy. She was seen two days ago for the same presenting symptoms. At last ER visit, an irregularly shaped GS w/o FP visualized as well as mod [facility]. She is Rh positive. She is hemodynamically stable, initially very uncomfortable and tearful on  arrival. CBC obtained - HgB stable, hCG downtrending from 5737 two days ago to 4865 today. U/S findings c/w abortion in progress, blood clot widening the lower uterine cavity and partially open cervix. Results discussed in detail w pt and her partner. Discussed that findings are now conclusive for SAB. Offered speculum exam to eval if clots/tissue may be removed as this could potentially provide some relief and tissue could be sent to path and for [facility] testing if pt desired. Pt declined speculum exam and ANORA testing at this time. Discussed options for expectant management vs medication management vs surgical management. Pt desires expectant management at this time. Will arrange for close f/up in clinic (seen at [facility]). Typical course of SAB reviewed as well as return precautions. Will discharge w PO Roxicodone  and Ibuprofen  for management of pain. Pt has Zofran  at home, declined Rx. Her pain was well managed w PO Roxicodone  and IV Toradol  here.   Assessment and Plan     ICD-10-CM   1. Spontaneous abortion in first trimester  O03.9     2. Vaginal bleeding  N93.9     Given Roxicodone  and IV Toradol  w relief of pain Rh positive U/S findings c/w SAB, hCG downtrending Rx for Roxicodone  and Ibuprofen  sent Desires expectant management, declined medication vs surgical management at this time Return precautions reviewed Message sent to [facility] to help arrange f/up   Melanie Spires, MD OB Fellow, Faculty Practice [facility], [facility]  07/18/2023, 7:12 AM

## 2023-07-18 NOTE — MAU Note (Signed)
 Pt says on Sunday- had U/S - baby is measuring 6 weeks and 6 days .  VB- started on Sunday- went to [facility] ER.  Tonight passing clots  In Triage - heavy vag  bleeding.  Cramping - 6-10/10

## 2023-07-23 ENCOUNTER — Other Ambulatory Visit: Payer: Self-pay

## 2023-07-23 ENCOUNTER — Other Ambulatory Visit: Payer: Self-pay | Admitting: Nurse Practitioner

## 2023-07-23 DIAGNOSIS — O3680X0 Pregnancy with inconclusive fetal viability, not applicable or unspecified: Secondary | ICD-10-CM

## 2023-07-23 DIAGNOSIS — E039 Hypothyroidism, unspecified: Secondary | ICD-10-CM

## 2023-07-24 ENCOUNTER — Other Ambulatory Visit: Payer: Self-pay | Admitting: Family Medicine

## 2023-07-24 ENCOUNTER — Other Ambulatory Visit: Payer: Self-pay

## 2023-07-24 ENCOUNTER — Ambulatory Visit: Payer: Self-pay | Admitting: Family Medicine

## 2023-07-24 ENCOUNTER — Ambulatory Visit (INDEPENDENT_AMBULATORY_CARE_PROVIDER_SITE_OTHER): Payer: Self-pay

## 2023-07-24 DIAGNOSIS — Z3A00 Weeks of gestation of pregnancy not specified: Secondary | ICD-10-CM | POA: Diagnosis not present

## 2023-07-24 DIAGNOSIS — O039 Complete or unspecified spontaneous abortion without complication: Secondary | ICD-10-CM

## 2023-07-24 DIAGNOSIS — O3680X0 Pregnancy with inconclusive fetal viability, not applicable or unspecified: Secondary | ICD-10-CM

## 2023-07-24 DIAGNOSIS — Z3A11 11 weeks gestation of pregnancy: Secondary | ICD-10-CM

## 2023-07-24 MED ORDER — MISOPROSTOL 200 MCG PO TABS: 800.0000 ug | ORAL_TABLET | Freq: Once | ORAL | 0 refills | Status: DC

## 2023-07-24 MED ORDER — ONDANSETRON 4 MG PO TBDP: 4.0000 mg | ORAL_TABLET | Freq: Three times a day (TID) | ORAL | 0 refills | Status: DC | PRN

## 2023-07-24 NOTE — Progress Notes (Signed)
 Patient was seen in the MAU for miscarriage.  Elected for expectant management has not taken Cytotec .  Will send dose of Cytotec  as well Zofran .  It looks like patient already has a prescription for oxycodone  which was sent in on 6/10.

## 2023-07-24 NOTE — Progress Notes (Unsigned)
 Patient chart and US  results reviewed by physician who states US  revealed clot in cervix that looks expected to pass soon, recommends patient Cytotec  treatment if amenable and to treading HCG levels on Wednesday to trend down to 0.  1650  Called patient and reviewed plan of care with patient. Patient reports she has not taken Cytotec  yet and would like to try medication to help pass clot. Patient reports some light bleeding with some blood clots, denies any pain. Reviewed MAU precaution with patient. Patient scheduled for HCG lab draw on 07/26/23 at 1100, confirmed scheduled appointment. Also reports wanting to try for pregnancy again once HCG levels are 0. Notified patient Zofran  PRN will also sent. All questions addressed and was advised to contact our office for any other questions or concerns.  Message sent to front office to get patient scheduled for SAB f/u.

## 2023-07-25 ENCOUNTER — Encounter: Payer: Self-pay | Admitting: Obstetrics and Gynecology

## 2023-07-25 ENCOUNTER — Other Ambulatory Visit: Payer: Self-pay

## 2023-07-25 DIAGNOSIS — O039 Complete or unspecified spontaneous abortion without complication: Secondary | ICD-10-CM

## 2023-07-26 ENCOUNTER — Other Ambulatory Visit: Payer: Self-pay

## 2023-07-26 DIAGNOSIS — O039 Complete or unspecified spontaneous abortion without complication: Secondary | ICD-10-CM

## 2023-07-27 LAB — BETA HCG QUANT (REF LAB): hCG Quant: 513 m[IU]/mL

## 2023-08-07 ENCOUNTER — Ambulatory Visit: Payer: Self-pay | Admitting: Obstetrics & Gynecology

## 2023-08-09 ENCOUNTER — Ambulatory Visit: Payer: Self-pay | Admitting: Certified Nurse Midwife

## 2023-09-05 ENCOUNTER — Other Ambulatory Visit: Payer: Self-pay

## 2023-09-05 ENCOUNTER — Encounter: Payer: Self-pay | Admitting: Advanced Practice Midwife

## 2023-09-05 ENCOUNTER — Ambulatory Visit (INDEPENDENT_AMBULATORY_CARE_PROVIDER_SITE_OTHER): Admitting: Advanced Practice Midwife

## 2023-09-05 ENCOUNTER — Ambulatory Visit

## 2023-09-05 VITALS — BP 103/69 | HR 74 | Wt 210.6 lb

## 2023-09-05 DIAGNOSIS — Z3A00 Weeks of gestation of pregnancy not specified: Secondary | ICD-10-CM | POA: Diagnosis not present

## 2023-09-05 DIAGNOSIS — O039 Complete or unspecified spontaneous abortion without complication: Secondary | ICD-10-CM | POA: Diagnosis not present

## 2023-09-05 DIAGNOSIS — O034 Incomplete spontaneous abortion without complication: Secondary | ICD-10-CM | POA: Diagnosis not present

## 2023-09-05 DIAGNOSIS — Z3A01 Less than 8 weeks gestation of pregnancy: Secondary | ICD-10-CM

## 2023-09-05 DIAGNOSIS — Z1331 Encounter for screening for depression: Secondary | ICD-10-CM

## 2023-09-05 NOTE — H&P (View-Only) (Signed)
 Preoperative History and Physical  Melissa Whitney is a 43 y.o. G2P1001 seen in clinic to discuss surgical management of retained products of conception.   No significant preoperative concerns.  Has had ongoing bleeding for over a month. Ultrasound in clinic today shows distended endometrium with vascularity consistent with retained products of conception.   Proposed surgery: D&C  Past Medical History:  Diagnosis Date   Depression    Dysmenorrhea    Gallstone    GERD (gastroesophageal reflux disease)    with pregnancy   History of migraine    Hypothyroidism    Mood disorder (HCC)    Neurologic cardiac syncope    no issues since age 79   PCOS (polycystic ovarian syndrome)    Pregnancy induced hypertension    Smoker 10/01/2018   Quit 06/18/2019   Toxic maculopathy    Vaginal Pap smear, abnormal    Past Surgical History:  Procedure Laterality Date   APPENDECTOMY     age 40   CHOLECYSTECTOMY N/A 06/09/2021   Procedure: LAPAROSCOPIC CHOLECYSTECTOMY;  Surgeon: Rubin Calamity, MD;  Location: [facility];  Service: General;  Laterality: N/A;   COLPOSCOPY     GYNECOLOGIC CRYOSURGERY     cervical   TONSILLECTOMY AND ADENOIDECTOMY     WISDOM TOOTH EXTRACTION     OB History  Gravida Para Term Preterm AB Living  2 1 1  0 0 1  SAB IAB Ectopic Multiple Live Births  0 0 0 0 1    # Outcome Date GA Lbr Len/2nd Weight Sex Type Anes PTL Lv  2 Current           1 Term 04/16/21 [redacted]w[redacted]d / 00:27 5 lb 8.9 oz (2.52 kg) F Vag-Spont EPI  LIV     Birth Comments: WNL  Patient denies any other pertinent gynecologic issues.   Current Outpatient Medications on File Prior to Visit  Medication Sig Dispense Refill   ibuprofen  (ADVIL ) 800 MG tablet Take 1 tablet (800 mg total) by mouth 3 (three) times daily. 30 tablet 0   levothyroxine  (SYNTHROID ) 112 MCG tablet TAKE 1 TABLET BY MOUTH DAILY BEFORE BREAKFAST MONDAY THROUGH SATURDAY. TAKE TWO TABLETS BY MOUTH ON SUNDAY 112 tablet 1   misoprostol   (CYTOTEC ) 200 MCG tablet Take 4 tablets (800 mcg total) by mouth once for 1 dose. 4 tablet 0   naloxone  (NARCAN ) nasal spray 4 mg/0.1 mL Use as needed to reverse opioid overdose (Patient not taking: Reported on 07/24/2023) 1 each 0   ondansetron  (ZOFRAN -ODT) 4 MG disintegrating tablet Take 1 tablet (4 mg total) by mouth every 8 (eight) hours as needed for nausea or vomiting. (Patient not taking: Reported on 09/05/2023) 20 tablet 0   oxyCODONE  (ROXICODONE ) 5 MG immediate release tablet Take 1 tablet (5 mg total) by mouth every 6 (six) hours as needed for severe pain (pain score 7-10) or breakthrough pain. (Patient not taking: Reported on 09/05/2023) 10 tablet 0   SEMAGLUTIDE-WEIGHT MANAGEMENT [place] Inject 10 Units into the skin.     sertraline  (ZOLOFT ) 100 MG tablet Take 1 tablet (100 mg total) by mouth daily. 90 tablet 1   No current facility-administered medications on file prior to visit.   Allergies  Allergen Reactions   Other Itching and Swelling    Poppy Seed   Papaver Other (See Comments)    Social History:   reports that she quit smoking about 4 years ago. Her smoking use included cigarettes. She has never used smokeless tobacco. She reports  that she does not currently use alcohol. She reports that she does not currently use drugs.  Family History  Problem Relation Age of Onset   Thyroid disease Mother    Endometriosis Mother    COPD Mother    Heart disease Father    Heart attack Father    Arthritis Sister        psoriatic   Thyroid disease Sister    Endometriosis Sister    Thyroid disease Maternal Grandmother    Heart disease Maternal Grandmother    Esophageal cancer Paternal Grandmother     Review of Systems: Pertinent items noted in HPI and remainder of comprehensive ROS otherwise negative.  PHYSICAL EXAM: LMP 04/29/2023   Physical Exam Constitutional:      General: She is not in acute distress.    Appearance: Normal appearance. She is not ill-appearing.  HENT:      Head: Atraumatic.  Eyes:     General: No scleral icterus.    Conjunctiva/sclera: Conjunctivae normal.  Pulmonary:     Effort: Pulmonary effort is normal.  Skin:    General: Skin is warm and dry.     Coloration: Skin is not jaundiced or pale.  Neurological:     Mental Status: She is alert.     Coordination: Coordination normal.  Psychiatric:        Mood and Affect: Mood normal.        Behavior: Behavior normal.     Labs: No results found for this or any previous visit (from the past 2 weeks).  Imaging Studies: No results found.  Assessment: Active Problems:   * No active hospital problems. *   Plan: After discussion of options, patient will undergo surgical management with D&C in the OR.   The risks of surgery were discussed in detail with the patient including but not limited to: bleeding which may require transfusion or reoperation; infection which may require antibiotics; injury to surrounding organs which may involve bowel, bladder, ureters; need for additional procedures including laparoscopy/laparotomy or subsequent procedures secondary to abnormal pathology; thromboembolic phenomenon, surgical site problems and other postoperative/anesthesia complications. Likelihood of success in alleviating the patient's condition was discussed. Routine postoperative instructions will be reviewed with the patient and her family in detail after surgery.  The patient concurred with the proposed plan, giving informed written consent for the surgery.    Donnice CHRISTELLA Carolus, MD, MPH, FAAFP Attending Family Medicine Physician, [facility], [facility]

## 2023-09-05 NOTE — Progress Notes (Signed)
 Ultrasounds Results Note  SUBJECTIVE HPI:  Ms. Melissa Whitney is a 43 y.o. G2P1001 with vaginal bleeding x 6 weeks and concern for incomplete miscarriage. Dx with incomplete miscarriage who presents to [facility] for followup after Dx of miscarriage. The patient reports vaginal bleeding that fluctuates between light and heavy since 07/16/23. No break in bleeding longer than 1-2 days. No bleeding today so far. Reports mild abdominal pain. Denies fever. Resumed IC during break in bleeding. Extremely doubtful of possible new pregnancy because she has only been able to get pregnant with medication to induce ovulation.   Upon review of the patient's records, patient was first seen in ED  on 07/16/23 for vaginal bleeding.   US  suspicious for failed pregnancy. F/U US  confirmed missed AB but it seemed like miscarriage was progressing spontaneously so no intervention was given. Cytotec  was offered at F/U visit but then pt passed what seem like tissue and did not use Cytotec .    Previous HCG's  Latest Reference Range & Units 07/26/23 11:19  hCG Quant mIU/mL 513   Lab Results  Component Value Date   HCGBETAQNT 4,865 (H) 07/18/2023   HCGBETAQNT 5,737 (H) 07/16/2023    Previous US  US  OB Transvaginal Result Date: 07/25/2023 ----------------------------------------------------------------------  OBSTETRICS REPORT                       (Signed Final 07/25/2023 02:21 pm) ---------------------------------------------------------------------- Patient Info  ID #:       [id number]                          D.O.B.:  03/16/1980 (42 yrs)(F)  Name:       Melissa Whitney                Visit Date: 07/24/2023 02:38 pm ---------------------------------------------------------------------- Performed By  Attending:        Steffan Rover MD     Ref. Address:     [address]                                                             [place], [place]                                                             [address]   Performed By:     Scarlet Flesher         [facility]                                                             [facility]                                                             [facility]  Referred By:      [facility] ---------------------------------------------------------------------- Orders  #  Description                           Code        Ordered By  1  US  OB TRANSVAGINAL                    [id number]     ALA CART ----------------------------------------------------------------------  #  Order #                     Accession #                Episode #  1  [id number]                   [id number]                 [id number] ---------------------------------------------------------------------- Indications  Weeks of gestation of pregnancy not            Z3A.00  specified  Pregnancy with inconclusive fetal viability    O36.80X0  Vaginal bleeding in pregnancy, first trimester O46.91 ---------------------------------------------------------------------- Fetal Evaluation  Num Of Fetuses:         1  Gest. Sac:              None seen  Yolk Sac:               Not visualized  Fetal Pole:             Not visualized  Cardiac Activity:       No embryo visualized  Comment:    No GS seen. Hetero appearing material in cervix ? clot. Patient              still actively bleeding. ---------------------------------------------------------------------- Impression  No gestational sac visualized. What appears to be a clot near  the cervix. ---------------------------------------------------------------------- Recommendations  Likely missed abortion with continued passage of clots. Offer  cytotec  to help this process. ----------------------------------------------------------------------                Steffan Rover, MD Electronically  Signed Final Report   07/25/2023 02:21 pm ----------------------------------------------------------------------   US  OB Transvaginal Result Date: 07/18/2023 CLINICAL DATA:  [id number]. Vaginal bleeding in first trimester pregnancy. Ob ultrasound 07/16/2023 demonstrated an irregular gestational sac measuring 6 weeks 6 days with absent fetal pole. EXAM: TRANSVAGINAL OB ULTRASOUND TECHNIQUE: Transvaginal ultrasound was performed for complete evaluation of the gestation as well as the maternal uterus, adnexal regions, and pelvic cul-de-sac. COMPARISON:  Study of  07/16/2023. FINDINGS: Intrauterine gestational sac: None. Yolk sac:  Not visualized. Embryo:  Not visualized. Cardiac Activity: N/a. MSD: None. CRL: None. Subchorionic hemorrhage:  None visualized. Maternal uterus/adnexae: The uterus is anteverted and measures 10.6 x 5.6 x 6.2 cm. There is a thickened endometrial complex in the distal cavity measuring 15 mm. There is heterogeneous avascular material widening the cavity in the lower uterine segment consistent with a blood clot. The channel of the cervix is not well seen but it appears at least partially open, on image 17/34 up to at least 6 mm. The findings are compatible with an abortion in progress. There are no recognizable fetal parts within the uterine content. Both ovaries are unremarkable. Left ovary is 3.1 x 2.0 x 2.9 cm, right ovary measuring 1.6 x 1.7 x 1.5 cm. No free fluid or adnexal mass are observed. IMPRESSION: 1. Ultrasound findings consistent with an abortion in progress. 2. Avascular material likely blood clot, widening the lower uterine cavity with at least a partially open cervix. Electronically Signed   By: Francis Quam M.D.   On: 07/18/2023 04:38   US  OB LESS THAN 14 WEEKS WITH OB TRANSVAGINAL Result Date: 07/16/2023 CLINICAL DATA:  Vaginal bleeding. LMP: 04/29/2023 corresponding to an estimated gestational age of [redacted] weeks, 1 day. EXAM: OBSTETRIC <14 WK US  AND TRANSVAGINAL OB US   TECHNIQUE: Both transabdominal and transvaginal ultrasound examinations were performed for complete evaluation of the gestation as well as the maternal uterus, adnexal regions, and pelvic cul-de-sac. Transvaginal technique was performed to assess early pregnancy. COMPARISON:  None Available. FINDINGS: Intrauterine gestational sac: Irregular and elongated gestational sac in the upper endometrium. Yolk sac:  Seen Embryo:  Not present Cardiac Activity: N/A MSD: 20 mm   6 w   6 d Subchorionic hemorrhage:  Moderate subchorionic hemorrhage. Maternal uterus/adnexae: The maternal ovaries are unremarkable. IMPRESSION: Irregular gestational sac with absent fetal pole. Findings suspicious but not diagnostic for a failed early pregnancy at this time. Clinical correlation and close follow-up recommended. Electronically Signed   By: Vanetta Chou M.D.   On: 07/16/2023 15:15     --/--/A POS (06/10 0327)  Repeat ultrasound was performed  today.   Past Medical History:  Diagnosis Date   Depression    Dysmenorrhea    Gallstone    GERD (gastroesophageal reflux disease)    with pregnancy   History of migraine    Hypothyroidism    Mood disorder (HCC)    Neurologic cardiac syncope    no issues since age 83   PCOS (polycystic ovarian syndrome)    Pregnancy induced hypertension    Smoker 10/01/2018   Quit 06/18/2019   Toxic maculopathy    Vaginal Pap smear, abnormal    Past Surgical History:  Procedure Laterality Date   APPENDECTOMY     age 20   CHOLECYSTECTOMY N/A 06/09/2021   Procedure: LAPAROSCOPIC CHOLECYSTECTOMY;  Surgeon: Rubin Calamity, MD;  Location: [facility];  Service: General;  Laterality: N/A;   COLPOSCOPY     GYNECOLOGIC CRYOSURGERY     cervical   TONSILLECTOMY AND ADENOIDECTOMY     WISDOM TOOTH EXTRACTION     Social History   Socioeconomic History   Marital status: Married    Spouse name: Not on file   Number of children: 1   Years of education: Not on file   Highest education  level: Not on file  Occupational History   Occupation: self employed  Tobacco Use   Smoking status: Former    Current  packs/day: 0.00    Types: Cigarettes    Quit date: 06/2019    Years since quitting: 4.2   Smokeless tobacco: Never  Vaping Use   Vaping status: Never Used  Substance and Sexual Activity   Alcohol use: Not Currently    Comment: 3 years   Drug use: Not Currently   Sexual activity: Yes  Other Topics Concern   Not on file  Social History Narrative   Not on file   Social Drivers of Health   Financial Resource Strain: Not on file  Food Insecurity: No Food Insecurity (09/05/2023)   Hunger Vital Sign    Worried About Running Out of Food in the Last Year: Never true    Ran Out of Food in the Last Year: Never true  Transportation Needs: No Transportation Needs (09/05/2023)   PRAPARE - Administrator, Civil Service (Medical): No    Lack of Transportation (Non-Medical): No  Physical Activity: Not on file  Stress: Not on file  Social Connections: Not on file  Intimate Partner Violence: Not At Risk (03/17/2021)   Humiliation, Afraid, Rape, and Kick questionnaire    Fear of Current or Ex-Partner: No    Emotionally Abused: No    Physically Abused: No    Sexually Abused: No   Current Outpatient Medications on File Prior to Visit  Medication Sig Dispense Refill   ibuprofen  (ADVIL ) 800 MG tablet Take 1 tablet (800 mg total) by mouth 3 (three) times daily. 30 tablet 0   levothyroxine  (SYNTHROID ) 112 MCG tablet TAKE 1 TABLET BY MOUTH DAILY BEFORE BREAKFAST MONDAY THROUGH SATURDAY. TAKE TWO TABLETS BY MOUTH ON SUNDAY 112 tablet 1   SEMAGLUTIDE-WEIGHT MANAGEMENT [place] Inject 10 Units into the skin.     sertraline  (ZOLOFT ) 100 MG tablet Take 1 tablet (100 mg total) by mouth daily. 90 tablet 1   misoprostol  (CYTOTEC ) 200 MCG tablet Take 4 tablets (800 mcg total) by mouth once for 1 dose. 4 tablet 0   naloxone  (NARCAN ) nasal spray 4 mg/0.1 mL Use as needed to reverse  opioid overdose (Patient not taking: Reported on 07/24/2023) 1 each 0   ondansetron  (ZOFRAN -ODT) 4 MG disintegrating tablet Take 1 tablet (4 mg total) by mouth every 8 (eight) hours as needed for nausea or vomiting. (Patient not taking: Reported on 09/05/2023) 20 tablet 0   oxyCODONE  (ROXICODONE ) 5 MG immediate release tablet Take 1 tablet (5 mg total) by mouth every 6 (six) hours as needed for severe pain (pain score 7-10) or breakthrough pain. (Patient not taking: Reported on 09/05/2023) 10 tablet 0   No current facility-administered medications on file prior to visit.   Allergies  Allergen Reactions   Other Itching and Swelling    Poppy Seed   Papaver Other (See Comments)    I have reviewed patient's Past Medical Hx, Surgical Hx, Family Hx, Social Hx, medications and allergies.   Review of Systems Review of Systems  Constitutional: Negative for fever and chills.  Gastrointestinal: Negative for abdominal pain.  Genitourinary: Negative for vaginal bleeding.  Musculoskeletal: Negative for back pain.  Neurological: Negative for dizziness and weakness.    Physical Exam  BP 103/69   Pulse 74   Wt 210 lb 9.6 oz (95.5 kg)   LMP 04/29/2023   BMI 32.98 kg/m   Patient's last menstrual period was 04/29/2023. GENERAL: Well-developed, well-nourished female in no acute distress.  HEENT: Normocephalic, atraumatic.   LUNGS: Effort normal ABDOMEN: Deferred HEART: Regular rate  SKIN: Warm, dry  and without erythema PSYCH: Normal mood and affect NEURO: Alert and oriented x 4 PELVIC: Deferred  LAB RESULTS No results found for this or any previous visit (from the past 24 hours).  IMAGING Per Dr. Lola US  C/W retained POC's. Hypervascular appearance noted.   ASSESSMENT 1. Incomplete miscarriage   2. Spontaneous abortion in first trimester     PLAN Discussed options for management of incomplete AB including expectant management, Cytotec  or D&C. Expectant management has not been  successful and I do not recommend continuing due to concern for infection.  Prefers D&C at this time. Dr. Lola came in to discuss R/B/I for [facility] Questions addressed. Pt gave verbal consent. Message sent to surgery scheduler to schedule procedure Friday 09/08/23.  Support given for pregnancy loss.  Go to MAU as needed for heavy bleeding, abdominal pain or fever greater than 100.4. Quant, CBC drawn prior to knowing that we would be able to do US  today. Results pending.   Oscar Forman  Claudene, CNM 09/05/2023 9:30 AM

## 2023-09-05 NOTE — H&P (Signed)
 Preoperative History and Physical  Melissa Whitney is a 43 y.o. G2P1001 seen in clinic to discuss surgical management of retained products of conception.   No significant preoperative concerns.  Has had ongoing bleeding for over a month. Ultrasound in clinic today shows distended endometrium with vascularity consistent with retained products of conception.   Proposed surgery: D&C  Past Medical History:  Diagnosis Date   Depression    Dysmenorrhea    Gallstone    GERD (gastroesophageal reflux disease)    with pregnancy   History of migraine    Hypothyroidism    Mood disorder (HCC)    Neurologic cardiac syncope    no issues since age 79   PCOS (polycystic ovarian syndrome)    Pregnancy induced hypertension    Smoker 10/01/2018   Quit 06/18/2019   Toxic maculopathy    Vaginal Pap smear, abnormal    Past Surgical History:  Procedure Laterality Date   APPENDECTOMY     age 40   CHOLECYSTECTOMY N/A 06/09/2021   Procedure: LAPAROSCOPIC CHOLECYSTECTOMY;  Surgeon: Rubin Calamity, MD;  Location: [facility];  Service: General;  Laterality: N/A;   COLPOSCOPY     GYNECOLOGIC CRYOSURGERY     cervical   TONSILLECTOMY AND ADENOIDECTOMY     WISDOM TOOTH EXTRACTION     OB History  Gravida Para Term Preterm AB Living  2 1 1  0 0 1  SAB IAB Ectopic Multiple Live Births  0 0 0 0 1    # Outcome Date GA Lbr Len/2nd Weight Sex Type Anes PTL Lv  2 Current           1 Term 04/16/21 [redacted]w[redacted]d / 00:27 5 lb 8.9 oz (2.52 kg) F Vag-Spont EPI  LIV     Birth Comments: WNL  Patient denies any other pertinent gynecologic issues.   Current Outpatient Medications on File Prior to Visit  Medication Sig Dispense Refill   ibuprofen  (ADVIL ) 800 MG tablet Take 1 tablet (800 mg total) by mouth 3 (three) times daily. 30 tablet 0   levothyroxine  (SYNTHROID ) 112 MCG tablet TAKE 1 TABLET BY MOUTH DAILY BEFORE BREAKFAST MONDAY THROUGH SATURDAY. TAKE TWO TABLETS BY MOUTH ON SUNDAY 112 tablet 1   misoprostol   (CYTOTEC ) 200 MCG tablet Take 4 tablets (800 mcg total) by mouth once for 1 dose. 4 tablet 0   naloxone  (NARCAN ) nasal spray 4 mg/0.1 mL Use as needed to reverse opioid overdose (Patient not taking: Reported on 07/24/2023) 1 each 0   ondansetron  (ZOFRAN -ODT) 4 MG disintegrating tablet Take 1 tablet (4 mg total) by mouth every 8 (eight) hours as needed for nausea or vomiting. (Patient not taking: Reported on 09/05/2023) 20 tablet 0   oxyCODONE  (ROXICODONE ) 5 MG immediate release tablet Take 1 tablet (5 mg total) by mouth every 6 (six) hours as needed for severe pain (pain score 7-10) or breakthrough pain. (Patient not taking: Reported on 09/05/2023) 10 tablet 0   SEMAGLUTIDE-WEIGHT MANAGEMENT [place] Inject 10 Units into the skin.     sertraline  (ZOLOFT ) 100 MG tablet Take 1 tablet (100 mg total) by mouth daily. 90 tablet 1   No current facility-administered medications on file prior to visit.   Allergies  Allergen Reactions   Other Itching and Swelling    Poppy Seed   Papaver Other (See Comments)    Social History:   reports that she quit smoking about 4 years ago. Her smoking use included cigarettes. She has never used smokeless tobacco. She reports  that she does not currently use alcohol. She reports that she does not currently use drugs.  Family History  Problem Relation Age of Onset   Thyroid disease Mother    Endometriosis Mother    COPD Mother    Heart disease Father    Heart attack Father    Arthritis Sister        psoriatic   Thyroid disease Sister    Endometriosis Sister    Thyroid disease Maternal Grandmother    Heart disease Maternal Grandmother    Esophageal cancer Paternal Grandmother     Review of Systems: Pertinent items noted in HPI and remainder of comprehensive ROS otherwise negative.  PHYSICAL EXAM: LMP 04/29/2023   Physical Exam Constitutional:      General: She is not in acute distress.    Appearance: Normal appearance. She is not ill-appearing.  HENT:      Head: Atraumatic.  Eyes:     General: No scleral icterus.    Conjunctiva/sclera: Conjunctivae normal.  Pulmonary:     Effort: Pulmonary effort is normal.  Skin:    General: Skin is warm and dry.     Coloration: Skin is not jaundiced or pale.  Neurological:     Mental Status: She is alert.     Coordination: Coordination normal.  Psychiatric:        Mood and Affect: Mood normal.        Behavior: Behavior normal.     Labs: No results found for this or any previous visit (from the past 2 weeks).  Imaging Studies: No results found.  Assessment: Active Problems:   * No active hospital problems. *   Plan: After discussion of options, patient will undergo surgical management with D&C in the OR.   The risks of surgery were discussed in detail with the patient including but not limited to: bleeding which may require transfusion or reoperation; infection which may require antibiotics; injury to surrounding organs which may involve bowel, bladder, ureters; need for additional procedures including laparoscopy/laparotomy or subsequent procedures secondary to abnormal pathology; thromboembolic phenomenon, surgical site problems and other postoperative/anesthesia complications. Likelihood of success in alleviating the patient's condition was discussed. Routine postoperative instructions will be reviewed with the patient and her family in detail after surgery.  The patient concurred with the proposed plan, giving informed written consent for the surgery.    Donnice CHRISTELLA Carolus, MD, MPH, FAAFP Attending Family Medicine Physician, [facility], [facility]

## 2023-09-06 ENCOUNTER — Encounter (HOSPITAL_COMMUNITY): Payer: Self-pay | Admitting: Family Medicine

## 2023-09-06 ENCOUNTER — Encounter: Payer: Self-pay | Admitting: Advanced Practice Midwife

## 2023-09-06 LAB — CBC
Hematocrit: 42.7 % (ref 34.0–46.6)
Hemoglobin: 13.8 g/dL (ref 11.1–15.9)
MCH: 26.7 pg (ref 26.6–33.0)
MCHC: 32.3 g/dL (ref 31.5–35.7)
MCV: 83 fL (ref 79–97)
Platelets: 385 x10E3/uL (ref 150–450)
RBC: 5.16 x10E6/uL (ref 3.77–5.28)
RDW: 12.5 % (ref 11.7–15.4)
WBC: 6.2 x10E3/uL (ref 3.4–10.8)

## 2023-09-06 LAB — BETA HCG QUANT (REF LAB): hCG Quant: 5 m[IU]/mL

## 2023-09-06 NOTE — Progress Notes (Signed)
 Spoke w/ via phone for pre-op interview--- pt Lab needs dos----     cbc/ t&s    Lab results------ no COVID test -----patient states asymptomatic no test needed Arrive at ------- 1115 on 09-08-2023 NPO after MN NO Solid Food.  Clear liquids from MN until--- 1015 Pre-Surgery Ensure or G2: n/a  Med rec completed Medications to take morning of surgery ----- zoloft , synthroid  Diabetic medication ----- n/a  GLP1 agonist last dose: 09-04-2023 GLP1 instructions: pt stated was not given instructions.  However,  pt was added on today Wednesday 07/30 for surgery on Friday 08/ 01 D&E  Patient instructed no nail polish to be worn day of surgery Patient instructed to bring photo id and insurance card day of surgery Patient aware to have Driver (ride ) / caregiver    for 24 hours after surgery - husband, danny fonorrow Patient Special Instructions ----- n/a Pre-Op special Instructions ----- n/a  Patient verbalized understanding of instructions that were given at this phone interview. Patient denies chest pain, sob, fever, cough at the interview.

## 2023-09-07 MED ORDER — DOXYCYCLINE HYCLATE 100 MG IV SOLR
200.0000 mg | Freq: Once | INTRAVENOUS | Status: AC
  Administered 2023-09-08: 200 mg via INTRAVENOUS
  Filled 2023-09-07 (×2): qty 200

## 2023-09-08 ENCOUNTER — Ambulatory Visit (HOSPITAL_COMMUNITY)

## 2023-09-08 ENCOUNTER — Ambulatory Visit (HOSPITAL_COMMUNITY): Admitting: Anesthesiology

## 2023-09-08 ENCOUNTER — Other Ambulatory Visit: Payer: Self-pay

## 2023-09-08 ENCOUNTER — Encounter (HOSPITAL_COMMUNITY): Payer: Self-pay | Admitting: Family Medicine

## 2023-09-08 ENCOUNTER — Encounter (HOSPITAL_COMMUNITY)
Admission: RE | Disposition: A | Discharge status: Home / Self Care | Payer: Self-pay | Source: Home / Self Care | Attending: Family Medicine

## 2023-09-08 ENCOUNTER — Ambulatory Visit (HOSPITAL_COMMUNITY)
Admission: RE | Admit: 2023-09-08 | Discharge: 2023-09-08 | Disposition: A | Discharge status: Home / Self Care | Attending: Family Medicine | Admitting: Family Medicine

## 2023-09-08 DIAGNOSIS — E039 Hypothyroidism, unspecified: Secondary | ICD-10-CM | POA: Diagnosis not present

## 2023-09-08 DIAGNOSIS — R519 Headache, unspecified: Secondary | ICD-10-CM | POA: Insufficient documentation

## 2023-09-08 DIAGNOSIS — Z87891 Personal history of nicotine dependence: Secondary | ICD-10-CM | POA: Diagnosis not present

## 2023-09-08 DIAGNOSIS — Z3A00 Weeks of gestation of pregnancy not specified: Secondary | ICD-10-CM

## 2023-09-08 DIAGNOSIS — K219 Gastro-esophageal reflux disease without esophagitis: Secondary | ICD-10-CM | POA: Insufficient documentation

## 2023-09-08 DIAGNOSIS — O034 Incomplete spontaneous abortion without complication: Secondary | ICD-10-CM

## 2023-09-08 DIAGNOSIS — Z79899 Other long term (current) drug therapy: Secondary | ICD-10-CM | POA: Diagnosis not present

## 2023-09-08 DIAGNOSIS — Z3A11 11 weeks gestation of pregnancy: Secondary | ICD-10-CM

## 2023-09-08 DIAGNOSIS — F319 Bipolar disorder, unspecified: Secondary | ICD-10-CM | POA: Insufficient documentation

## 2023-09-08 DIAGNOSIS — Z79890 Hormone replacement therapy (postmenopausal): Secondary | ICD-10-CM | POA: Insufficient documentation

## 2023-09-08 DIAGNOSIS — Z01818 Encounter for other preprocedural examination: Secondary | ICD-10-CM

## 2023-09-08 HISTORY — DX: Personal history of other complications of pregnancy, childbirth and the puerperium: Z87.59

## 2023-09-08 HISTORY — DX: Migraine, unspecified, not intractable, without status migrainosus: G43.909

## 2023-09-08 HISTORY — DX: Personal history of cervical dysplasia: Z87.410

## 2023-09-08 HISTORY — DX: Family history of other specified conditions: Z84.89

## 2023-09-08 HISTORY — DX: Bipolar II disorder: F31.81

## 2023-09-08 HISTORY — DX: Presence of spectacles and contact lenses: Z97.3

## 2023-09-08 HISTORY — PX: DILATION AND EVACUATION: SHX1459

## 2023-09-08 LAB — CBC
HCT: 39.6 % (ref 36.0–46.0)
Hemoglobin: 13.5 g/dL (ref 12.0–15.0)
MCH: 26.9 pg (ref 26.0–34.0)
MCHC: 34.1 g/dL (ref 30.0–36.0)
MCV: 78.9 fL — ABNORMAL LOW (ref 80.0–100.0)
Platelets: 368 K/uL (ref 150–400)
RBC: 5.02 MIL/uL (ref 3.87–5.11)
RDW: 12.7 % (ref 11.5–15.5)
WBC: 7.7 K/uL (ref 4.0–10.5)
nRBC: 0 % (ref 0.0–0.2)

## 2023-09-08 LAB — TYPE AND SCREEN
ABO/RH(D): A POS
Antibody Screen: NEGATIVE

## 2023-09-08 SURGERY — DILATION AND EVACUATION, UTERUS
Anesthesia: General | Site: Vagina

## 2023-09-08 MED ORDER — LIDOCAINE 2% (20 MG/ML) 5 ML SYRINGE
INTRAMUSCULAR | Status: AC
  Filled 2023-09-08: qty 5

## 2023-09-08 MED ORDER — MISOPROSTOL 200 MCG PO TABS
ORAL_TABLET | ORAL | Status: AC
  Filled 2023-09-08: qty 5

## 2023-09-08 MED ORDER — KETOROLAC TROMETHAMINE 30 MG/ML IJ SOLN
INTRAMUSCULAR | Status: AC
  Filled 2023-09-08: qty 1

## 2023-09-08 MED ORDER — BUPIVACAINE-EPINEPHRINE (PF) 0.25% -1:200000 IJ SOLN
INTRAMUSCULAR | Status: AC
  Filled 2023-09-08: qty 30

## 2023-09-08 MED ORDER — MIDAZOLAM HCL 2 MG/2ML IJ SOLN
INTRAMUSCULAR | Status: DC | PRN
  Administered 2023-09-08: 2 mg via INTRAVENOUS

## 2023-09-08 MED ORDER — CHLORHEXIDINE GLUCONATE 0.12 % MT SOLN
15.0000 mL | Freq: Once | OROMUCOSAL | Status: AC
  Administered 2023-09-08: 15 mL via OROMUCOSAL

## 2023-09-08 MED ORDER — ONDANSETRON HCL 4 MG/2ML IJ SOLN
INTRAMUSCULAR | Status: AC
  Filled 2023-09-08: qty 2

## 2023-09-08 MED ORDER — FENTANYL CITRATE (PF) 100 MCG/2ML IJ SOLN: 25.0000 ug | INTRAMUSCULAR | Status: DC | PRN

## 2023-09-08 MED ORDER — SUGAMMADEX SODIUM 200 MG/2ML IV SOLN
INTRAVENOUS | Status: DC | PRN
Start: 2023-09-08 — End: 2023-09-08
  Administered 2023-09-08: 200 mg via INTRAVENOUS

## 2023-09-08 MED ORDER — ACETAMINOPHEN 10 MG/ML IV SOLN
1000.0000 mg | Freq: Once | INTRAVENOUS | Status: DC | PRN
Start: 2023-09-08 — End: 2023-09-08

## 2023-09-08 MED ORDER — ONDANSETRON 4 MG PO TBDP
4.0000 mg | ORAL_TABLET | Freq: Three times a day (TID) | ORAL | 1 refills | Status: DC | PRN
Start: 2023-09-08 — End: 2023-11-16

## 2023-09-08 MED ORDER — ONDANSETRON HCL 4 MG/2ML IJ SOLN
INTRAMUSCULAR | Status: DC | PRN
  Administered 2023-09-08: 4 mg via INTRAVENOUS

## 2023-09-08 MED ORDER — ROCURONIUM BROMIDE 100 MG/10ML IV SOLN
INTRAVENOUS | Status: DC | PRN
  Administered 2023-09-08: 50 mg via INTRAVENOUS

## 2023-09-08 MED ORDER — CARBOPROST TROMETHAMINE 250 MCG/ML IM SOLN
INTRAMUSCULAR | Status: AC
  Filled 2023-09-08: qty 1

## 2023-09-08 MED ORDER — FENTANYL CITRATE (PF) 250 MCG/5ML IJ SOLN
INTRAMUSCULAR | Status: AC
  Filled 2023-09-08: qty 5

## 2023-09-08 MED ORDER — DEXAMETHASONE SODIUM PHOSPHATE 10 MG/ML IJ SOLN
INTRAMUSCULAR | Status: AC
  Filled 2023-09-08: qty 1

## 2023-09-08 MED ORDER — ACETAMINOPHEN 325 MG PO TABS: 650.0000 mg | ORAL_TABLET | Freq: Four times a day (QID) | ORAL | 0 refills | Status: DC | PRN

## 2023-09-08 MED ORDER — ORAL CARE MOUTH RINSE: 15.0000 mL | Freq: Once | OROMUCOSAL | Status: AC

## 2023-09-08 MED ORDER — DEXAMETHASONE SODIUM PHOSPHATE 10 MG/ML IJ SOLN
INTRAMUSCULAR | Status: DC | PRN
  Administered 2023-09-08: 10 mg via INTRAVENOUS

## 2023-09-08 MED ORDER — PROPOFOL 10 MG/ML IV BOLUS
INTRAVENOUS | Status: DC | PRN
  Administered 2023-09-08: 200 mg via INTRAVENOUS

## 2023-09-08 MED ORDER — TRANEXAMIC ACID-NACL 1000-0.7 MG/100ML-% IV SOLN
INTRAVENOUS | Status: AC
  Filled 2023-09-08: qty 100

## 2023-09-08 MED ORDER — HYDROMORPHONE HCL 1 MG/ML IJ SOLN
INTRAMUSCULAR | Status: AC
  Filled 2023-09-08: qty 1

## 2023-09-08 MED ORDER — CHLORHEXIDINE GLUCONATE 0.12 % MT SOLN
OROMUCOSAL | Status: AC
  Filled 2023-09-08: qty 15

## 2023-09-08 MED ORDER — KETOROLAC TROMETHAMINE 30 MG/ML IJ SOLN
INTRAMUSCULAR | Status: DC | PRN
  Administered 2023-09-08: 30 mg via INTRAVENOUS

## 2023-09-08 MED ORDER — PROPOFOL 10 MG/ML IV BOLUS
INTRAVENOUS | Status: AC
  Filled 2023-09-08: qty 20

## 2023-09-08 MED ORDER — METHYLERGONOVINE MALEATE 0.2 MG/ML IJ SOLN
INTRAMUSCULAR | Status: AC
  Filled 2023-09-08: qty 1

## 2023-09-08 MED ORDER — MIDAZOLAM HCL 2 MG/2ML IJ SOLN
INTRAMUSCULAR | Status: AC
  Filled 2023-09-08: qty 2

## 2023-09-08 MED ORDER — FENTANYL CITRATE (PF) 250 MCG/5ML IJ SOLN
INTRAMUSCULAR | Status: DC | PRN
  Administered 2023-09-08: 100 ug via INTRAVENOUS

## 2023-09-08 MED ORDER — SODIUM CHLORIDE (PF) 0.9 % IJ SOLN
INTRAMUSCULAR | Status: AC
  Filled 2023-09-08: qty 10

## 2023-09-08 MED ORDER — LIDOCAINE 2% (20 MG/ML) 5 ML SYRINGE
INTRAMUSCULAR | Status: DC | PRN
  Administered 2023-09-08: 1 mg via INTRAVENOUS
  Administered 2023-09-08: 100 mg via INTRAVENOUS

## 2023-09-08 MED ORDER — IBUPROFEN 600 MG PO TABS: 600.0000 mg | ORAL_TABLET | Freq: Four times a day (QID) | ORAL | 3 refills | Status: DC | PRN

## 2023-09-08 MED ORDER — EPHEDRINE SULFATE-NACL 50-0.9 MG/10ML-% IV SOSY
PREFILLED_SYRINGE | INTRAVENOUS | Status: DC | PRN
Start: 2023-09-08 — End: 2023-09-08
  Administered 2023-09-08: 5 mg via INTRAVENOUS

## 2023-09-08 MED ORDER — 0.9 % SODIUM CHLORIDE (POUR BTL) OPTIME
TOPICAL | Status: DC | PRN
  Administered 2023-09-08: 1000 mL

## 2023-09-08 MED ORDER — OXYCODONE HCL 5 MG PO TABS: 5.0000 mg | ORAL_TABLET | ORAL | 0 refills | Status: DC | PRN

## 2023-09-08 MED ORDER — OXYCODONE HCL 5 MG/5ML PO SOLN: 5.0000 mg | Freq: Once | ORAL | Status: DC | PRN

## 2023-09-08 MED ORDER — OXYCODONE HCL 5 MG PO TABS: 5.0000 mg | ORAL_TABLET | Freq: Once | ORAL | Status: DC | PRN

## 2023-09-08 MED ORDER — LACTATED RINGERS IV SOLN: INTRAVENOUS | Status: DC

## 2023-09-08 SURGICAL SUPPLY — 16 items
CLOTH BEACON ORANGE TIMEOUT ST (SAFETY) ×1 IMPLANT
DECANTER SPIKE VIAL GLASS SM (MISCELLANEOUS) ×1 IMPLANT
GLOVE BIOGEL PI IND STRL 7.0 (GLOVE) ×1 IMPLANT
GLOVE BIOGEL PI IND STRL 7.5 (GLOVE) ×1 IMPLANT
GLOVE ECLIPSE 7.5 STRL STRAW (GLOVE) ×1 IMPLANT
GLOVE SURG SS PI 7.0 STRL IVOR (GLOVE) IMPLANT
GOWN STRL REUS W/TWL LRG LVL3 (GOWN DISPOSABLE) ×2 IMPLANT
KIT BERKELEY 1ST TRIMESTER 3/8 (MISCELLANEOUS) ×1 IMPLANT
NS IRRIG 1000ML POUR BTL (IV SOLUTION) ×1 IMPLANT
PACK VAGINAL MINOR WOMEN LF (CUSTOM PROCEDURE TRAY) ×1 IMPLANT
PAD OB MATERNITY 4.3X12.25 (PERSONAL CARE ITEMS) ×1 IMPLANT
PAD PREP 24X48 CUFFED NSTRL (MISCELLANEOUS) ×1 IMPLANT
SET BERKELEY SUCTION TUBING (SUCTIONS) ×1 IMPLANT
SOL PREP POV-IOD 4OZ 10% (MISCELLANEOUS) IMPLANT
TOWEL OR 17X24 6PK STRL BLUE (TOWEL DISPOSABLE) ×2 IMPLANT
VACURETTE 8 RIGID CVD (CANNULA) IMPLANT

## 2023-09-08 NOTE — Transfer of Care (Signed)
 Immediate Anesthesia Transfer of Care Note  Patient: Melissa Whitney  Procedure(s) Performed: DILATION AND EVACUATION, UTERUS (Vagina )  Patient Location: PACU  Anesthesia Type:General  Level of Consciousness: awake, alert , oriented, and patient cooperative  Airway & Oxygen Therapy: Patient Spontanous Breathing  Post-op Assessment: Report given to RN, Post -op Vital signs reviewed and stable, Patient moving all extremities X 4, and Patient able to stick tongue midline  Post vital signs: Reviewed and stable  Last Vitals:  Vitals Value Taken Time  BP 172/68   Temp 36.7 C 09/08/23 14:21  Pulse 89 09/08/23 14:21  Resp 18 09/08/23 14:21  SpO2 94 % 09/08/23 14:21  Vitals shown include unfiled device data.  Last Pain:  Vitals:   09/08/23 1136  TempSrc: Oral  PainSc: 0-No pain      Patients Stated Pain Goal: 3 (09/08/23 1136)  Complications: No notable events documented.

## 2023-09-08 NOTE — Discharge Instructions (Signed)

## 2023-09-08 NOTE — Anesthesia Preprocedure Evaluation (Signed)
 Anesthesia Evaluation  Patient identified by MRN, date of birth, ID band Patient awake    Reviewed: Allergy & Precautions, NPO status , Patient's Chart, lab work & pertinent test results  History of Anesthesia Complications Negative for: history of anesthetic complications  Airway Mallampati: III  TM Distance: >3 FB Neck ROM: Full    Dental  (+) Teeth Intact, Dental Advisory Given   Pulmonary neg shortness of breath, neg sleep apnea, neg COPD, neg recent URI, former smoker   breath sounds clear to auscultation       Cardiovascular (-) hypertension(-) angina (-) Past MI and (-) CHF  Rhythm:Regular     Neuro/Psych  Headaches PSYCHIATRIC DISORDERS  Depression Bipolar Disorder      GI/Hepatic Neg liver ROS,GERD  ,,  Endo/Other  Hypothyroidism    Renal/GU negative Renal ROS     Musculoskeletal negative musculoskeletal ROS (+)    Abdominal   Peds  Hematology negative hematology ROS (+)   Anesthesia Other Findings   Reproductive/Obstetrics                              Anesthesia Physical Anesthesia Plan  ASA: 2  Anesthesia Plan: General   Post-op Pain Management: Ofirmev  IV (intra-op)* and Toradol  IV (intra-op)*   Induction: Intravenous  PONV Risk Score and Plan: 3 and Ondansetron , Dexamethasone  and Midazolam   Airway Management Planned: Oral ETT  Additional Equipment: None  Intra-op Plan:   Post-operative Plan: Extubation in OR  Informed Consent: I have reviewed the patients History and Physical, chart, labs and discussed the procedure including the risks, benefits and alternatives for the proposed anesthesia with the patient or authorized representative who has indicated his/her understanding and acceptance.     Dental advisory given  Plan Discussed with: CRNA  Anesthesia Plan Comments:         Anesthesia Quick Evaluation

## 2023-09-08 NOTE — OR Nursing (Signed)
 When I wheeled patient to her husbands car he commented that she had what looked like tiny red freckles.  I thought that was just her normal skin, She was like than upon arrival from OR.  Patient was in no distress.  The patient said I'm sure its fine . I told them it was possibly from an antibiotic or something she got in the OR,  I told them to call if it doesn't resolve or any respiratory issues.  Andree Kerns rn

## 2023-09-08 NOTE — Op Note (Addendum)
 D&C Operative Note   09/08/2023  PRE-OP DIAGNOSIS: retained products of conception   POST-OP DIAGNOSIS: same  SURGEON: Surgeons and Role:    * Lola Melissa HERO, MD - Primary  ASSISTANT: none  PROCEDURE:  Suction dilation and curettage under ultrasound guidance  ANESTHESIA: general  ESTIMATED BLOOD LOSS: 25mL  DRAINS: none  TOTAL IV FLUIDS: 250 cc  SPECIMENS: products of conception to pathology  VTE PROPHYLAXIS: SCDs to the bilateral lower extremities  ANTIBIOTICS: Doxycycline   COMPLICATIONS: none  DISPOSITION: PACU - hemodynamically stable.  CONDITION: stable  BLOOD TYPE: A POS. Rhogam given:not applicable  FINDINGS: Exam under anesthesia revealed 5 week sized uterus with no masses and bilateral adnexa without masses or fullness. Necrotic appearing products of conception were seen, with gritty texture in all four quadrants. Patient sounded to 5-6 cm.  PROCEDURE IN DETAIL:  After informed consent was obtained, the patient was taken to the operating room where anesthesia was obtained without difficulty. The patient was positioned in the dorsal lithotomy position in [place] stirrups. The patient was examined under anesthesia, with the above noted findings. In addition a sonographer was at bedside and assisted throughout the case.  The bi-valved speculum was placed inside the patient's vagina, and the the anterior lip of the cervix was seen and grasped with the tenaculum.  The cervix was progressively dilated to a 23 French-Pratt dilator.  The suction was then calibrated to and connected to the number 8 cannula, which was then introduced into the uterine cavity under ultrasound guidance with the above noted findings. A gentle curettage was done at the end and yield no products of conception.   The suction was then done one more time to remove any remaining curettage material. Excellent hemostasis was noted, and all instruments were removed, with excellent hemostasis noted  throughout.  She was then taken out of dorsal lithotomy. The patient tolerated the procedure well.  Sponge, lap and instrument counts were correct x2.  The patient was taken to recovery room in excellent condition.   Melissa Whitney Lola, MD, MPH, FAAFP Attending Family Medicine Physician, [facility] for [facility], [facility]

## 2023-09-08 NOTE — Anesthesia Procedure Notes (Signed)
 Procedure Name: LMA Insertion Date/Time: 09/08/2023 1:43 PM  Performed by: Viviana Almarie DASEN, CRNAPre-anesthesia Checklist: Patient identified, Emergency Drugs available, Suction available and Patient being monitored Patient Re-evaluated:Patient Re-evaluated prior to induction Oxygen Delivery Method: Circle System Utilized Preoxygenation: Pre-oxygenation with 100% oxygen Induction Type: IV induction Ventilation: Mask ventilation without difficulty LMA: LMA inserted LMA Size: 4.0 Tube type: Oral Number of attempts: 1 Airway Equipment and Method: Bite block Placement Confirmation: positive ETCO2 Tube secured with: Tape Dental Injury: Teeth and Oropharynx as per pre-operative assessment

## 2023-09-08 NOTE — Interval H&P Note (Signed)
 History and Physical Interval Note:  09/08/2023 12:37 PM  Melissa Whitney  has presented today for surgery, with the diagnosis of incomplete abortion.  The various methods of treatment have been discussed with the patient and family. After consideration of risks, benefits and other options for treatment, the patient has consented to  Procedure(s): DILATION AND EVACUATION, UTERUS (N/A) as a surgical intervention.  The patient's history has been reviewed, patient examined, no change in status, stable for surgery.  I have reviewed the patient's chart and labs.  Questions were answered to the patient's satisfaction.     Donnice CHRISTELLA Carolus

## 2023-09-11 ENCOUNTER — Encounter (HOSPITAL_COMMUNITY): Payer: Self-pay | Admitting: Family Medicine

## 2023-09-11 MED ORDER — HYDROMORPHONE HCL 1 MG/ML IJ SOLN
INTRAMUSCULAR | Status: DC | PRN
  Administered 2023-09-08: 1 mg via INTRAVENOUS

## 2023-09-12 LAB — SURGICAL PATHOLOGY

## 2023-09-13 ENCOUNTER — Other Ambulatory Visit (HOSPITAL_COMMUNITY)

## 2023-09-20 NOTE — Anesthesia Postprocedure Evaluation (Signed)
 Anesthesia Post Note  Patient: Melissa Whitney  Procedure(s) Performed: DILATION AND EVACUATION, UTERUS (Vagina )     Patient location during evaluation: PACU Anesthesia Type: General Level of consciousness: awake and alert Pain management: pain level controlled Vital Signs Assessment: post-procedure vital signs reviewed and stable Respiratory status: spontaneous breathing, nonlabored ventilation and respiratory function stable Cardiovascular status: blood pressure returned to baseline and stable Postop Assessment: no apparent nausea or vomiting Anesthetic complications: no   No notable events documented.               Sabriya Yono

## 2023-09-26 ENCOUNTER — Encounter: Payer: Self-pay | Admitting: Family Medicine

## 2023-09-26 ENCOUNTER — Ambulatory Visit: Admitting: Family Medicine

## 2023-09-26 ENCOUNTER — Other Ambulatory Visit: Payer: Self-pay

## 2023-09-26 VITALS — BP 132/83 | HR 79 | Wt 206.8 lb

## 2023-09-26 DIAGNOSIS — Z1331 Encounter for screening for depression: Secondary | ICD-10-CM | POA: Diagnosis not present

## 2023-09-26 DIAGNOSIS — Z3A11 11 weeks gestation of pregnancy: Secondary | ICD-10-CM

## 2023-09-26 DIAGNOSIS — E282 Polycystic ovarian syndrome: Secondary | ICD-10-CM | POA: Diagnosis not present

## 2023-09-26 DIAGNOSIS — N970 Female infertility associated with anovulation: Secondary | ICD-10-CM

## 2023-09-26 DIAGNOSIS — O034 Incomplete spontaneous abortion without complication: Secondary | ICD-10-CM

## 2023-09-26 IMAGING — CT CT ABD-PELV W/ CM
2 of 4 series · 15 of 46 positions shown, 17 images · IV contrast (APPLIED)
Comparison: None.

CLINICAL DATA: Abdominal pain and vomiting, 5 weeks postpartum.

EXAM:
CT ABDOMEN AND PELVIS WITH CONTRAST
TECHNIQUE: Multidetector CT imaging of the abdomen and pelvis was performed
using the standard protocol following bolus administration of
intravenous contrast.

[Series 2: abd pel w · axial · 0.86mm/px · z∈[+585,+1030]mm · 12 of 99 slices shown, 14 images]
[im 5/99  soft-tissue]
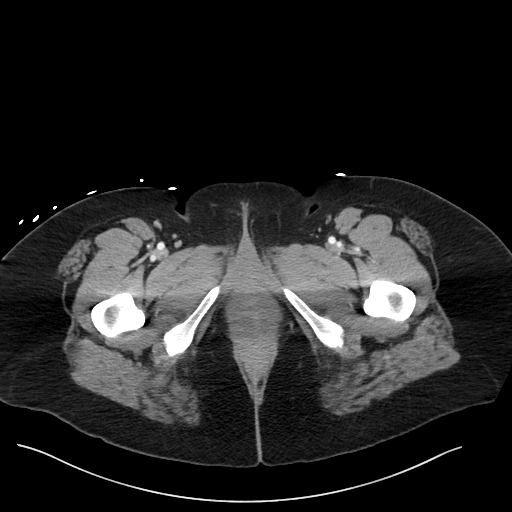
[im 5/99  bone]
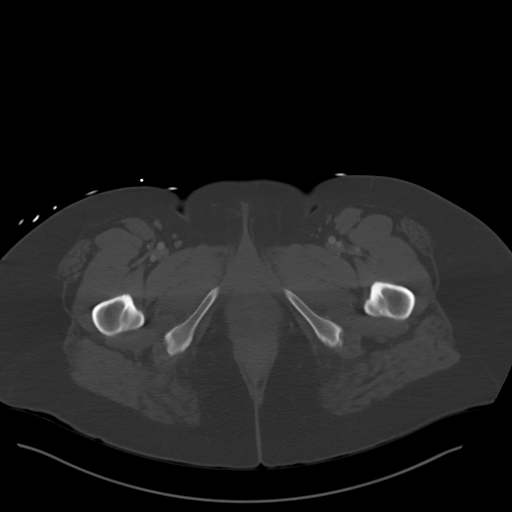
[im 13/99  soft-tissue]
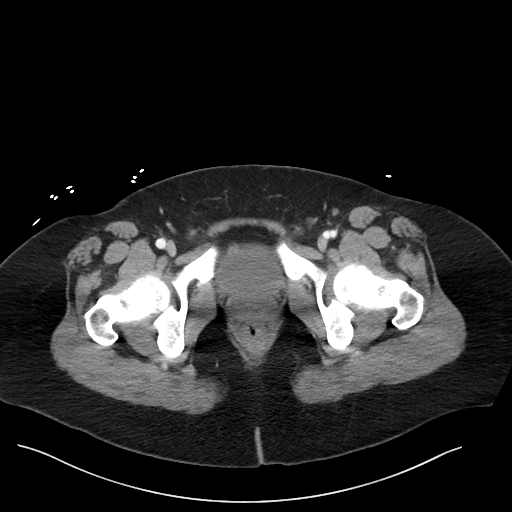
[im 21/99  soft-tissue]
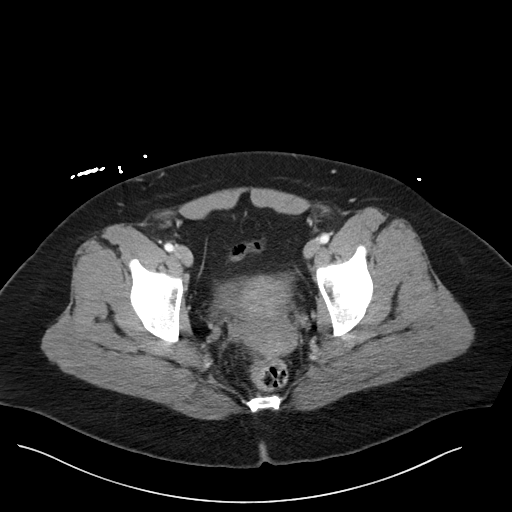
[im 29/99  soft-tissue]
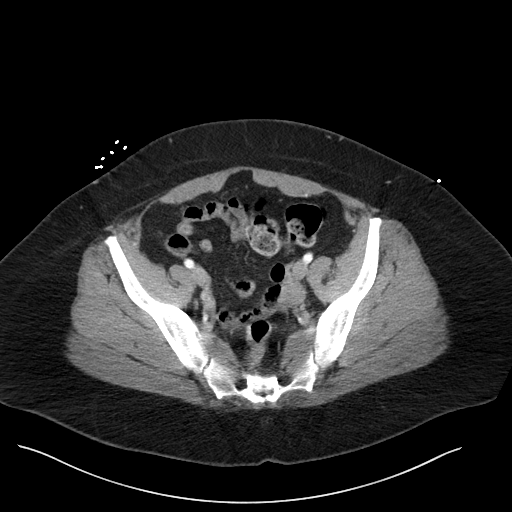
[im 37/99  soft-tissue]
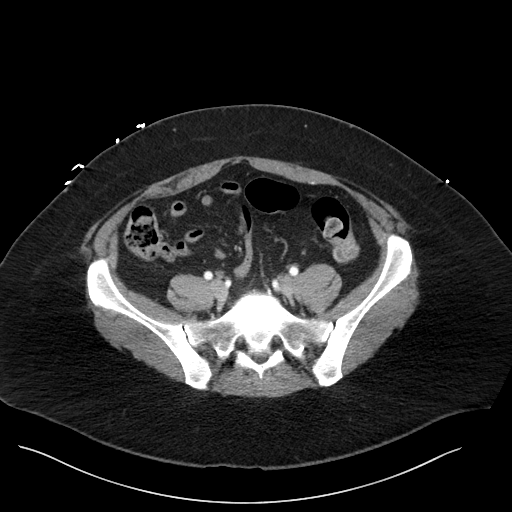
[im 45/99  soft-tissue]
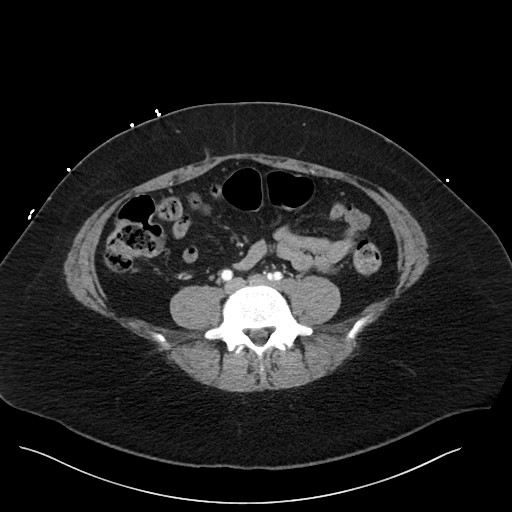
[im 54/99  soft-tissue]
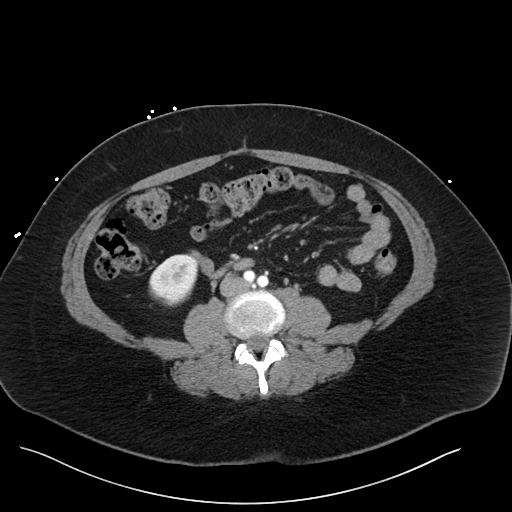
[im 62/99  soft-tissue]
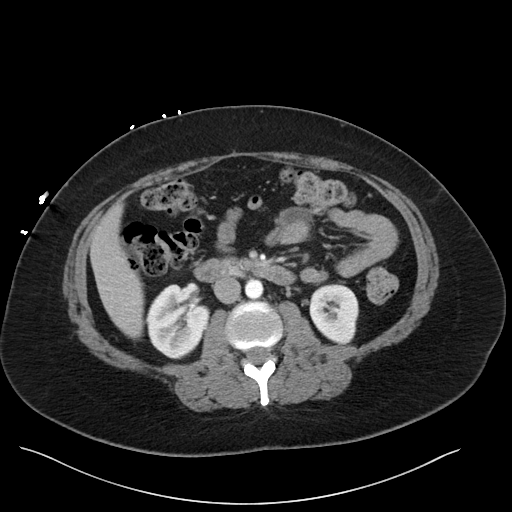
[im 70/99  soft-tissue]
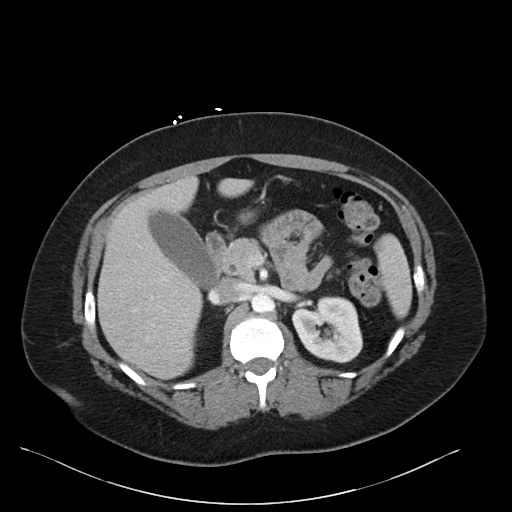
[im 70/99  bone]
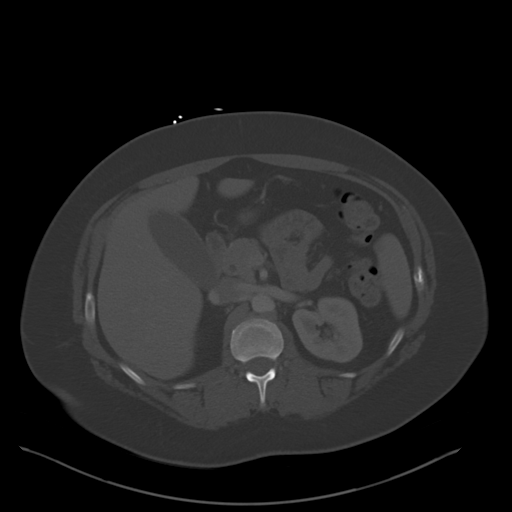
[im 78/99  soft-tissue]
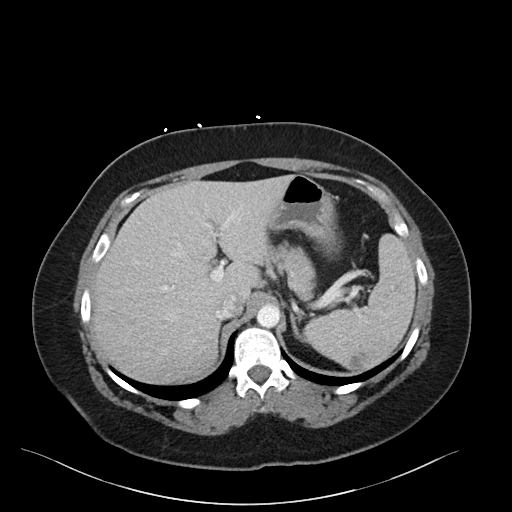
[im 86/99  soft-tissue]
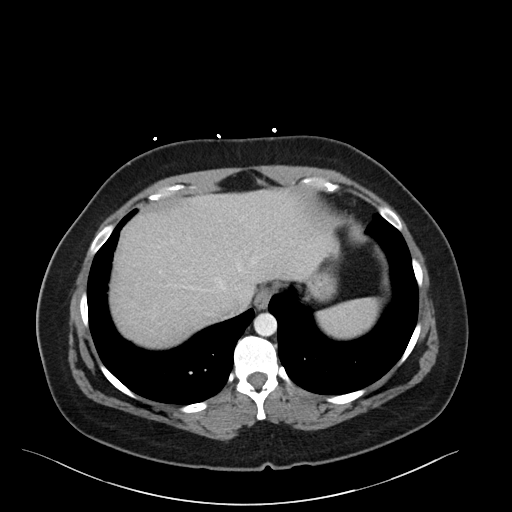
[im 94/99  soft-tissue]
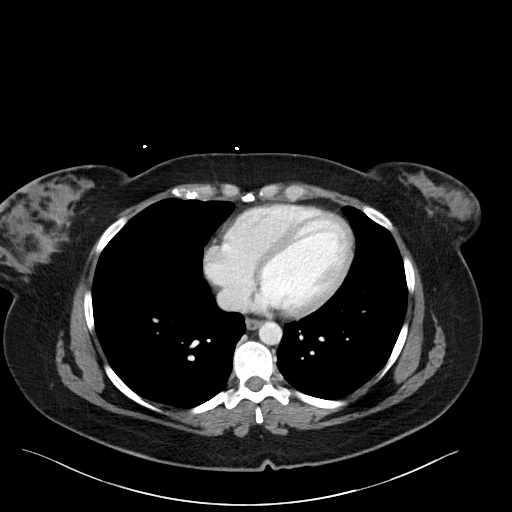

[Series 5: coronal · coronal · 0.70mm/px · 3 of 107 slices shown]
[im 36/107  soft-tissue]
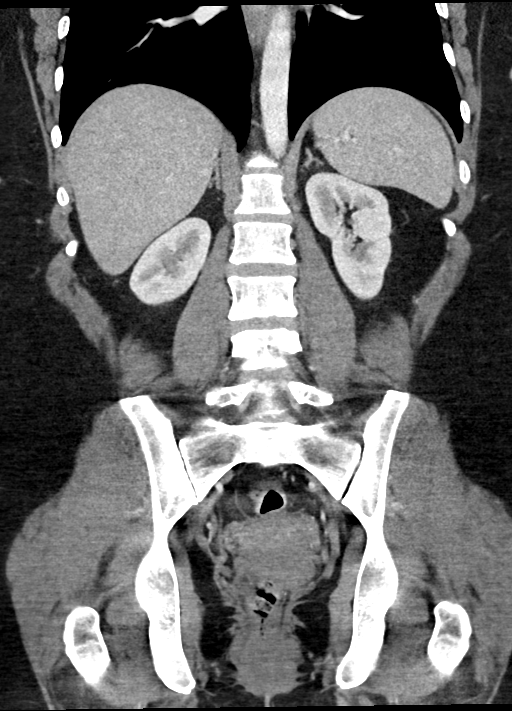
[im 48/107  soft-tissue]
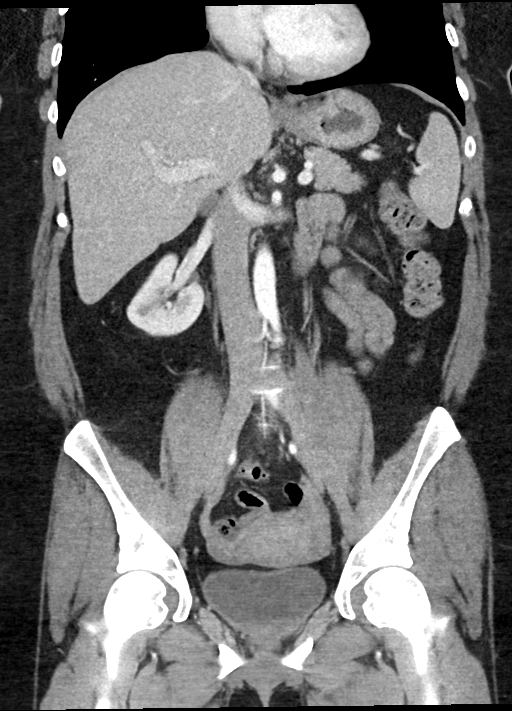
[im 59/107  soft-tissue]
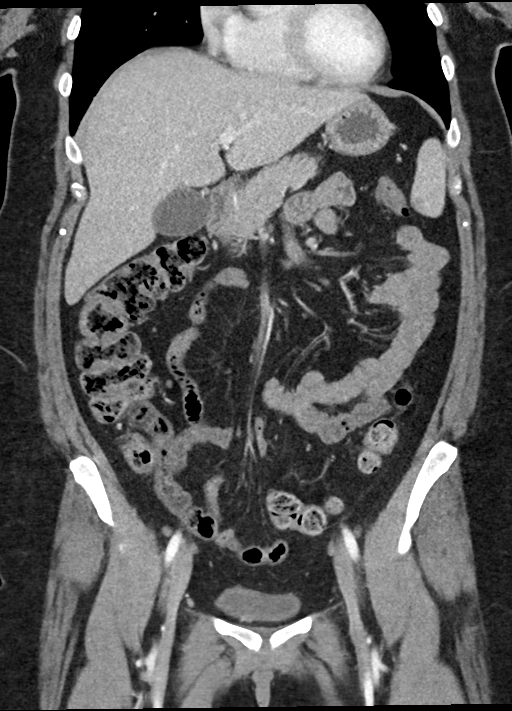

[15 of 46 positions shown; findings below may reference images not displayed]

RADIATION DOSE REDUCTION: This exam was performed according to the
departmental dose-optimization program which includes automated
exposure control, adjustment of the mA and/or kV according to
patient size and/or use of iterative reconstruction technique.

CONTRAST:  100mL OMNIPAQUE IOHEXOL 300 MG/ML  SOLN
FINDINGS: Lower chest: No acute abnormality.

Hepatobiliary: No focal liver abnormality is seen. A thin layer of
tiny gallstones is seen within the lumen of an otherwise
normal-appearing gallbladder (coronal reformatted image 70, CT
series 5).

Pancreas: Unremarkable. No pancreatic ductal dilatation or
surrounding inflammatory changes.

Spleen: Small lobulated areas of parenchymal low attenuation are
seen within the anterior and posterior aspect of the spleen. The
largest measures approximately 1.9 cm x 1.2 cm.

Adrenals/Urinary Tract: Adrenal glands are unremarkable. Kidneys are
normal, without renal calculi, focal lesion, or hydronephrosis.
Bladder is unremarkable.

Stomach/Bowel: Stomach is within normal limits. The appendix is
surgically absent. No evidence of bowel wall thickening, distention,
or inflammatory changes.

Vascular/Lymphatic: No significant vascular findings are present. No
enlarged abdominal or pelvic lymph nodes.

Reproductive: Uterus and bilateral adnexa are unremarkable.

Other: No abdominal wall hernia or abnormality. No abdominopelvic
ascites.

Musculoskeletal: No acute or significant osseous findings.
IMPRESSION: 1. Small splenic cysts versus hemangiomas. Correlation with
follow-up nonemergent splenic ultrasound is recommended.
2. Cholelithiasis.

## 2023-09-26 MED ORDER — LETROZOLE 2.5 MG PO TABS: 2.5000 mg | ORAL_TABLET | Freq: Every day | ORAL | 0 refills | Status: DC

## 2023-09-26 MED ORDER — MEDROXYPROGESTERONE ACETATE 10 MG PO TABS: 10.0000 mg | ORAL_TABLET | Freq: Every day | ORAL | 3 refills | Status: DC

## 2023-09-26 NOTE — Patient Instructions (Signed)
 Letrozole  INSTRUCTIONS  WHY USE IT? Letrozole  helps your ovaries to release eggs (ovulate).  HOW TO USE IT? Clomid is taken as a pill usually on days 3,4,5,6,7 of your cycle.  Day 1 is the first day of your period. The dose or duration may be changed to achieve ovulation.  Provera  (progesterone) may first be used to bring on a period for some patients. The day of ovulation on Clomid is usually between cycle day 14 and 17.  Having sexual intercourse at least every other day between cycle day 13 and 18 will improve your chances of becoming pregnant during the Letrozole  cycle.  You may monitor your ovulation using basal body temperature charts or with ovulation kits.  If using the ovulation predictor kits, having intercourse the day of the surge and the two days following is recommended. If you get your period, call when it starts for an appointment with your doctor, so that an exam may be done, and another Letrozole  cycle can be considered if appropriate. If you do not get a period by day 35 of the cycle, please get a blood pregnancy test.  If it is negative, speak to your doctor for instructions to bring on another period and to plan a follow-up appointment.  THINGS TO KNOW: If you get pregnant while using Letrozole , your chance of twins is 7%m and triplets is less than 1%. Some studies have suggested the use of fertility drugs may increase your risk of ovarian cancers in the future.  It is unclear if these drugs increase the risk, or people who have problems with fertility are prone for these cancers.  If there is an actual risk, it is very low.  If you have a history of liver problems or ovarian cancer, it may be wise to avoid this medication.  SIDE EFFECTS: The most common side effect is hot flashes (20%). Breast tenderness, headaches, nausea, bloating may also occur at different times. Less than 3/1,000 people have dryness or loss of hair. Persistent ovarian cysts may form from the use of this  medication. Ovarian hyperstimulation syndrome is a rare side effect at low doses. Visual changes like flashes of light or blurring.

## 2023-09-26 NOTE — Assessment & Plan Note (Signed)
Lengthy discussion had with pt. to explain PCOS, diagrams and verbal communication used to show impact on ovaries, endometrium, need for endometrial protection, impact on fertility, medical treatments to achieve necessary endpoints.  Should be on cyclical progestin vs. OC's if not trying to get pregnant.  Should be on Femara or clomid if trying to conceive.  

## 2023-09-26 NOTE — Assessment & Plan Note (Signed)
 Provera  and Letrozole  Take Provera  to start cycle. The day it starts is day #1. Take Femara  Day 3-7. Have sex day 10-20 at least every other day. On Day 30 take pregnancy test. If negative, restart Provera . If you get a cycle prior to day 30, that is day 1 and take Femara  on day 3-7 again and repeat above. If positive, schedule appointment for OB care.  Please take prenatal vitamin daily.

## 2023-09-26 NOTE — Assessment & Plan Note (Signed)
 Doing well. No longer bleeding. Path reviewed with pt.

## 2023-09-26 NOTE — Progress Notes (Signed)
   Subjective:    Patient ID: Melissa Whitney is a 43 y.o. female presenting with Follow-up  on 09/26/2023  HPI: Here for f/u folowing a D & C for retained POC. Surg path reviewed and showed POC. No longer bleeding. Interested in trying again. Needs Letrozole  for conception. H/o PCOS or anovulation.  Review of Systems  Constitutional:  Negative for chills and fever.  Respiratory:  Negative for shortness of breath.   Cardiovascular:  Negative for chest pain.  Gastrointestinal:  Negative for abdominal pain, nausea and vomiting.  Genitourinary:  Negative for dysuria.  Skin:  Negative for rash.      Objective:    BP 132/83   Pulse 79   Wt 206 lb 12.8 oz (93.8 kg)   LMP 04/29/2023 Comment: 07/2023  MAB  Breastfeeding No   BMI 32.39 kg/m  Physical Exam Exam conducted with a chaperone present.  Constitutional:      General: She is not in acute distress.    Appearance: She is well-developed.  HENT:     Head: Normocephalic and atraumatic.  Eyes:     General: No scleral icterus. Cardiovascular:     Rate and Rhythm: Normal rate.  Pulmonary:     Effort: Pulmonary effort is normal.  Abdominal:     Palpations: Abdomen is soft.  Musculoskeletal:     Cervical back: Neck supple.  Skin:    General: Skin is warm and dry.  Neurological:     Mental Status: She is alert and oriented to person, place, and time.         Assessment & Plan:   Problem List Items Addressed This Visit       Unprioritized   Retained products of conception after miscarriage - Primary   Doing well. No longer bleeding. Path reviewed with pt.       PCOS (polycystic ovarian syndrome)   Lengthy discussion had with pt. to explain PCOS, diagrams and verbal communication used to show impact on ovaries, endometrium, need for endometrial protection, impact on fertility, medical treatments to achieve necessary endpoints.  Should be on cyclical progestin vs. OC's if not trying to get pregnant.  Should be on  Femara  or clomid if trying to conceive.       Infertility associated with anovulation   Provera  and Letrozole  Take Provera  to start cycle. The day it starts is day #1. Take Femara  Day 3-7. Have sex day 10-20 at least every other day. On Day 30 take pregnancy test. If negative, restart Provera . If you get a cycle prior to day 30, that is day 1 and take Femara  on day 3-7 again and repeat above. If positive, schedule appointment for OB care.  Please take prenatal vitamin daily.       Relevant Medications   medroxyPROGESTERone  (PROVERA ) 10 MG tablet   letrozole  (FEMARA ) 2.5 MG tablet     No follow-ups on file.  Glenys GORMAN Birk, MD 09/26/2023 10:00 AM

## 2023-09-27 IMAGING — US US ABDOMEN LIMITED
1 series · 14 of 25 positions shown · non-contrast
Comparison: CT abdomen pelvis from yesterday.

CLINICAL DATA: Right upper quadrant pain.

EXAM:
ULTRASOUND ABDOMEN LIMITED RIGHT UPPER QUADRANT

[Series 1: us abdomen limited ruq (liver/gb) · 14 of 45 slices shown]
[im 1/45]
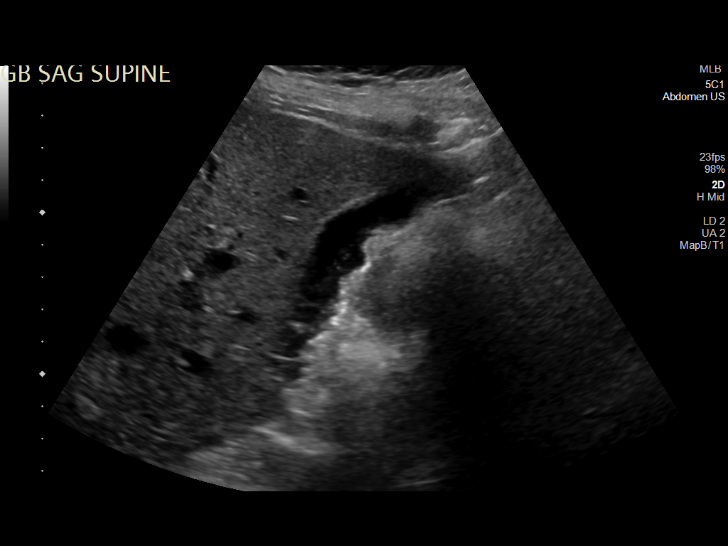
[im 4/45]
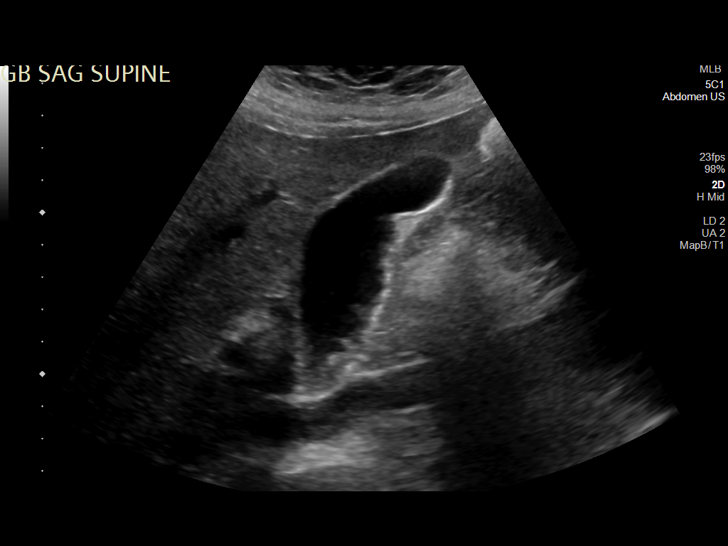
[im 8/45]
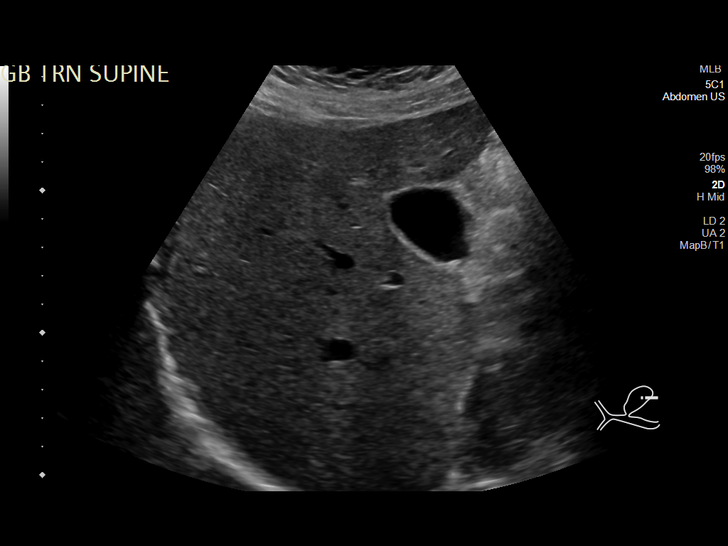
[im 12/45]
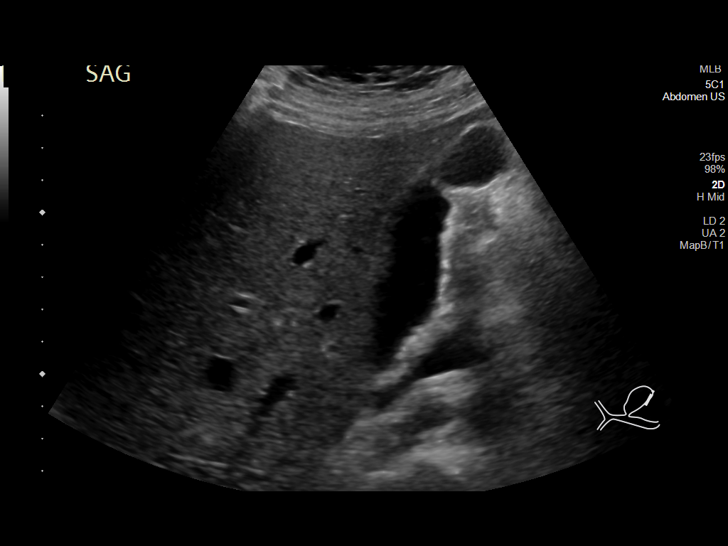
[im 15/45]
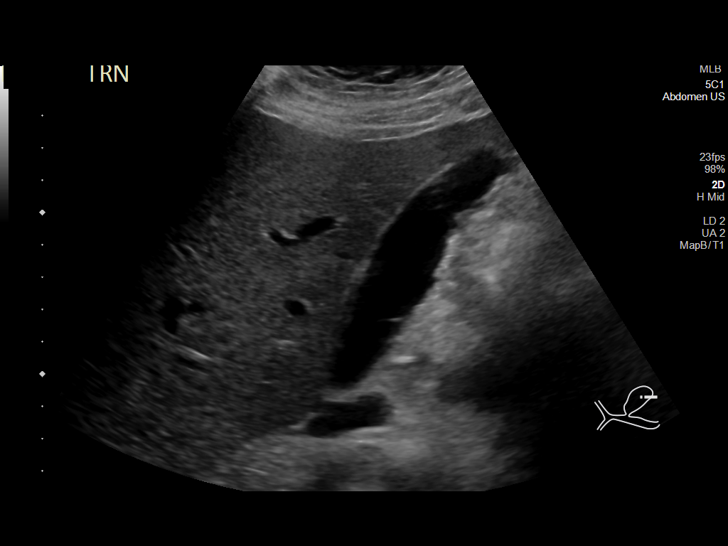
[im 17/45]
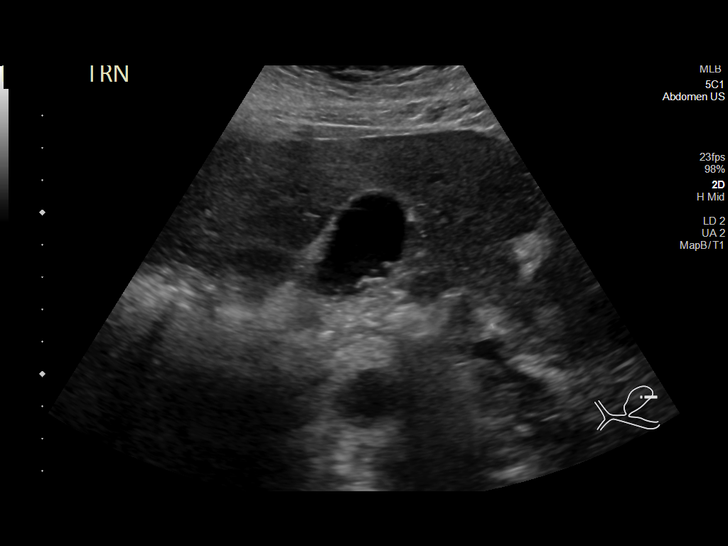
[im 21/45]
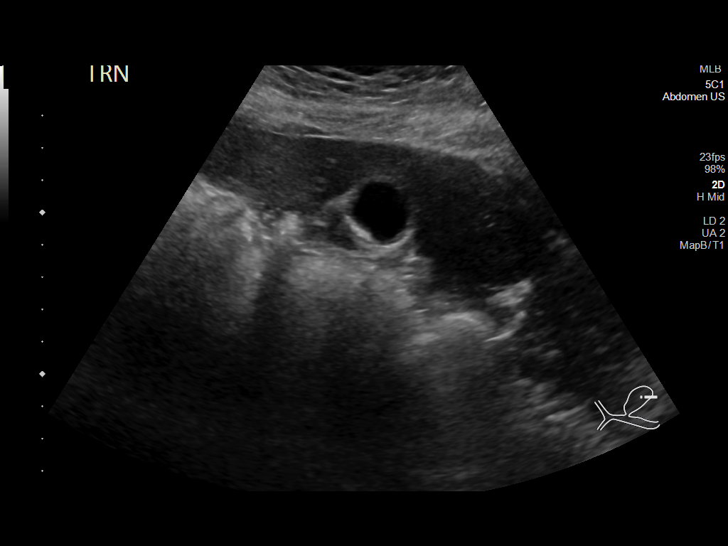
[im 24/45]
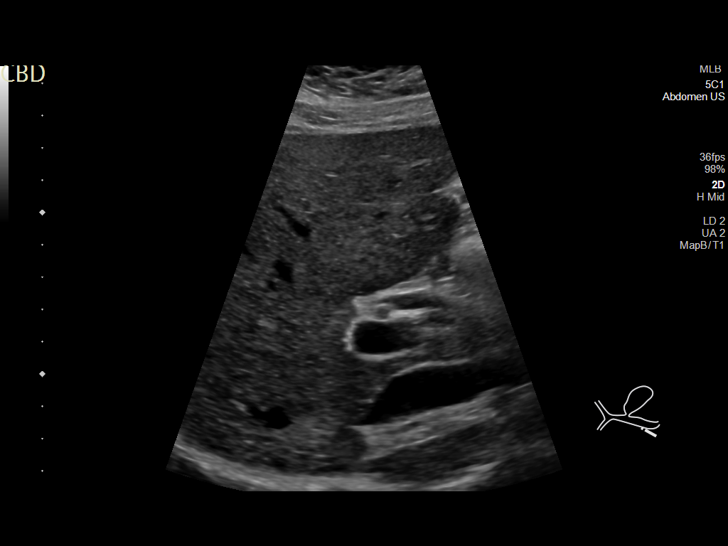
[im 28/45]
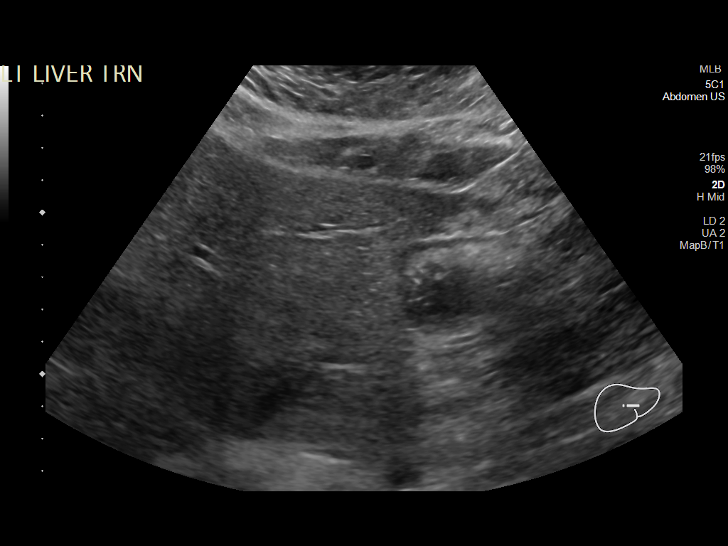
[im 30/45]
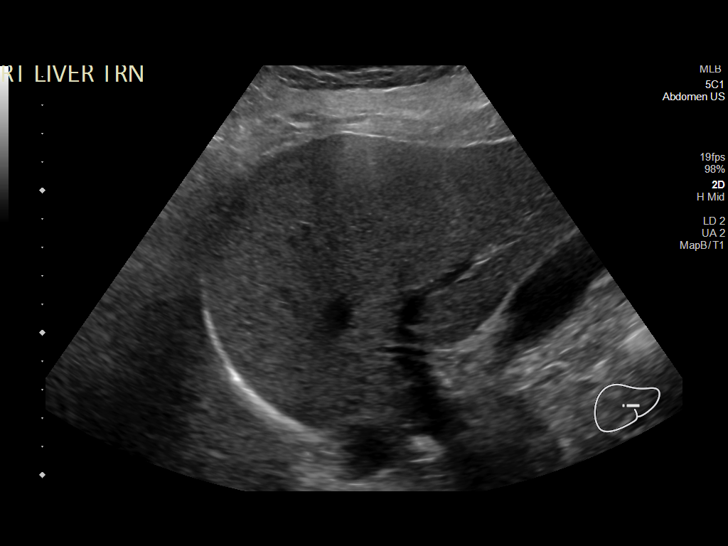
[im 34/45]
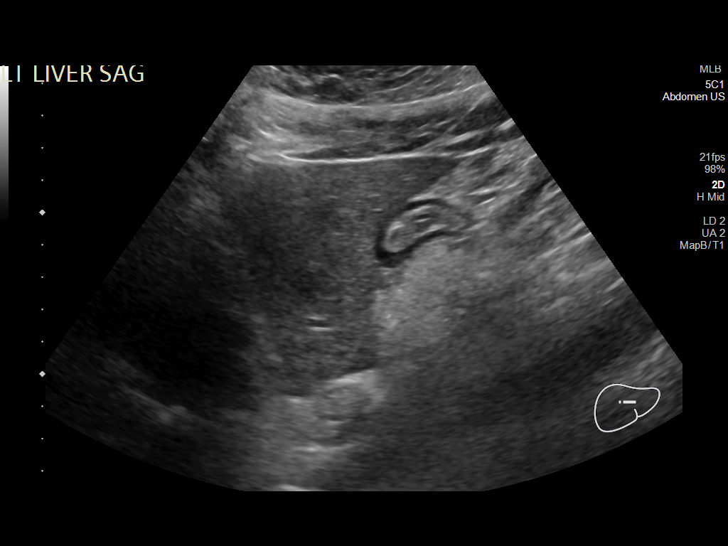
[im 37/45]
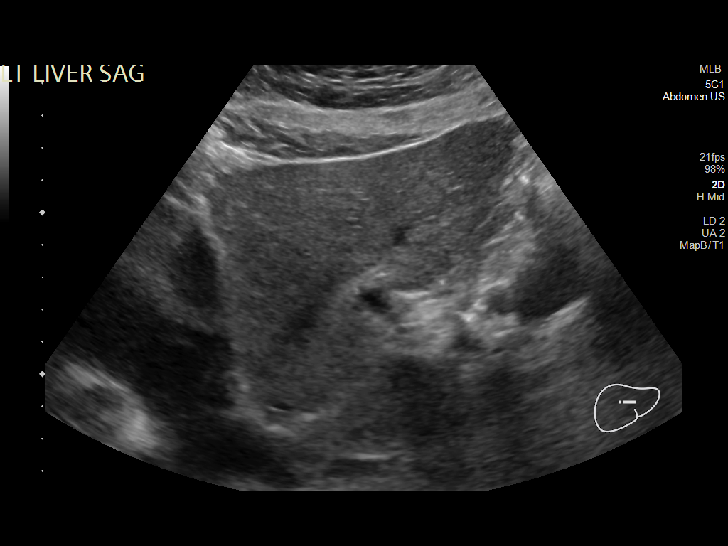
[im 41/45]
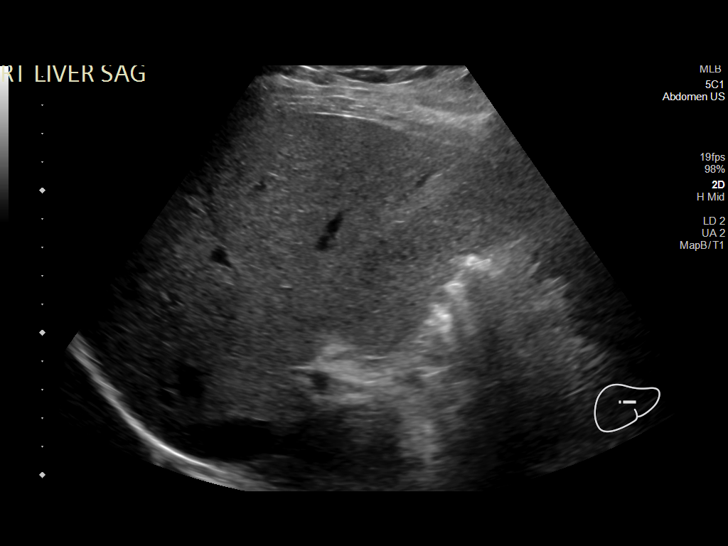
[im 45/45]
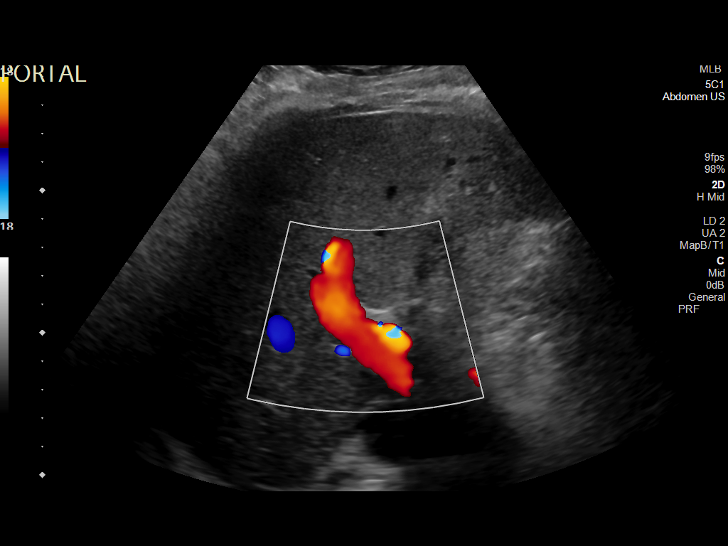

[14 of 25 positions shown; findings below may reference images not displayed]

FINDINGS: Gallbladder:

Multiple tiny gallstones. No wall thickening visualized. No
sonographic Murphy sign noted by sonographer.

Common bile duct:

Diameter: 5 mm, normal.

Liver:

No focal lesion identified. Within normal limits in parenchymal
echogenicity. Portal vein is patent on color Doppler imaging with
normal direction of blood flow towards the liver.

Other: None.
IMPRESSION: 1. Cholelithiasis without sonographic evidence of acute
cholecystitis.

## 2023-11-09 ENCOUNTER — Ambulatory Visit: Admitting: Nurse Practitioner

## 2023-11-09 NOTE — Progress Notes (Deleted)
 LILLETTE Kristeen JINNY Gladis, CMA,acting as a Neurosurgeon for [name], FNP.,have documented all relevant documentation on the behalf of [name], FNP,as directed by  [name], FNP while in the presence of [name], FNP.  Subjective:  Patient ID: Melissa Whitney , female    DOB: 1980-09-02 , 43 y.o.   MRN: [id number]  No chief complaint on file.   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 turner ; age 13 had tilt table for hx multiple syncopal episodes  w/ seizure activity,  negative neurology work-up;  03-14-2000  +  vasovagal syncope w/ pause asystole 8.5 seconds , recommended SSRI or possible PPM, pt started taking paxil daily ;  Tilt table done again 04-03-2000 negative study on paxil,  pt did not need PPM   Hypothyroidism    followed by pcp   Migraines    PCOS (polycystic ovarian syndrome)    Retained products of conception after miscarriage 07/2023   Wears glasses      Family History  Problem Relation Age of Onset   Thyroid disease Mother    Endometriosis Mother    COPD Mother    Heart disease Father    Heart attack Father    Arthritis Sister        psoriatic   Thyroid disease Sister    Endometriosis Sister    Thyroid disease Maternal Grandmother    Heart disease Maternal Grandmother    Esophageal cancer Paternal Grandmother      Current Outpatient Medications:    acetaminophen  (TYLENOL ) 325 MG  tablet, Take 2 tablets (650 mg total) by mouth every 6 (six) hours as needed., Disp: 60 tablet, Rfl: 0   ibuprofen  (ADVIL ) 600 MG tablet, Take 1 tablet (600 mg total) by mouth every 6 (six) hours as needed., Disp: 60 tablet, Rfl: 3   letrozole  (FEMARA ) 2.5 MG tablet, Take 1 tablet (2.5 mg total) by mouth daily. Take on days 3-7 during each cycle, Disp: 15 tablet, Rfl: 0   levothyroxine  (SYNTHROID ) 112 MCG tablet, TAKE 1 TABLET BY MOUTH DAILY BEFORE BREAKFAST MONDAY THROUGH SATURDAY. TAKE TWO TABLETS BY MOUTH ON SUNDAY (Patient taking differently: 112 mcg.), Disp: 112 tablet, Rfl: 1   medroxyPROGESTERone  (PROVERA ) 10 MG tablet, Take 1 tablet (10 mg total) by mouth daily., Disp: 10 tablet, Rfl: 3   naloxone  (NARCAN ) nasal spray 4 mg/0.1 mL, Use as needed to reverse opioid overdose (Patient not taking: Reported on 09/26/2023), Disp: 1 each, Rfl: 0   ondansetron  (ZOFRAN -ODT) 4 MG disintegrating tablet, Take 1 tablet (4 mg total) by mouth every 8 (eight) hours as needed for nausea or vomiting., Disp: 20 tablet, Rfl: 1   OVER THE COUNTER MEDICATION, Take 1 each by mouth at bedtime as needed (sleep). THC gummy, Disp: , Rfl:  oxyCODONE  (OXY IR/ROXICODONE ) 5 MG immediate release tablet, Take 1 tablet (5 mg total) by mouth every 4 (four) hours as needed for severe pain (pain score 7-10) or breakthrough pain., Disp: 10 tablet, Rfl: 0   SEMAGLUTIDE-WEIGHT MANAGEMENT Grady, Inject 5 mg into the skin once a week. Monday's, Disp: , Rfl:    sertraline  (ZOLOFT ) 100 MG tablet, Take 1 tablet (100 mg total) by mouth daily., Disp: 90 tablet, Rfl: 1   Allergies  Allergen Reactions   Lamictal [Lamotrigine] Other (See Comments)    Caused right eye toxic maculopathy   Other Itching and Swelling    Poppy Seed     Review of Systems   There were no vitals filed for this visit. There is no height or weight on file to calculate BMI.  Wt Readings from Last 3 Encounters:  09/26/23 206 lb 12.8 oz (93.8 kg)  09/08/23 209 lb  (94.8 kg)  09/05/23 210 lb 9.6 oz (95.5 kg)    The ASCVD Risk score (Arnett DK, et al., 2019) failed to calculate for the following reasons:   Cannot find a previous HDL lab   Cannot find a previous total cholesterol lab  Objective:  Physical Exam      Assessment And Plan:  Acquired hypothyroidism    No follow-ups on file.  Patient was given opportunity to ask questions. Patient verbalized understanding of the plan and was able to repeat key elements of the plan. All questions were answered to their satisfaction.    LILLETTE [name], FNP, have reviewed all documentation for this visit. The documentation on 11/09/23 for the exam, diagnosis, procedures, and orders are all accurate and complete.   IF YOU HAVE BEEN REFERRED TO A SPECIALIST, IT MAY TAKE 1-2 WEEKS TO SCHEDULE/PROCESS THE REFERRAL. IF YOU HAVE NOT HEARD FROM US /SPECIALIST IN TWO WEEKS, PLEASE GIVE US  A CALL AT [phone].

## 2023-11-16 ENCOUNTER — Ambulatory Visit: Admitting: Nurse Practitioner

## 2023-11-16 VITALS — BP 130/80 | HR 78 | Temp 98.5°F | Ht 67.0 in | Wt 205.0 lb

## 2023-11-16 DIAGNOSIS — E66811 Obesity, class 1: Secondary | ICD-10-CM | POA: Diagnosis not present

## 2023-11-16 DIAGNOSIS — D229 Melanocytic nevi, unspecified: Secondary | ICD-10-CM

## 2023-11-16 DIAGNOSIS — E039 Hypothyroidism, unspecified: Secondary | ICD-10-CM | POA: Diagnosis not present

## 2023-11-16 DIAGNOSIS — E6609 Other obesity due to excess calories: Secondary | ICD-10-CM | POA: Diagnosis not present

## 2023-11-16 DIAGNOSIS — Z2821 Immunization not carried out because of patient refusal: Secondary | ICD-10-CM

## 2023-11-16 DIAGNOSIS — Z6832 Body mass index (BMI) 32.0-32.9, adult: Secondary | ICD-10-CM

## 2023-11-16 DIAGNOSIS — Z139 Encounter for screening, unspecified: Secondary | ICD-10-CM

## 2023-11-16 MED ORDER — LEVOTHYROXINE SODIUM 112 MCG PO TABS: 112.0000 ug | ORAL_TABLET | Freq: Every day | ORAL | 1 refills | Status: DC

## 2023-11-16 NOTE — Assessment & Plan Note (Signed)
 Managed with levothyroxine  112 mcg daily. No current symptoms. Thyroid function needs re-evaluation. - Order thyroid function tests. - Refill levothyroxine  prescription.

## 2023-11-16 NOTE — Assessment & Plan Note (Signed)
 Mole on face since first pregnancy, regular but slightly rough.  - Refer to dermatologist for evaluation and possible removal. - Photograph mole for medical record taken

## 2023-11-16 NOTE — Progress Notes (Signed)
 Melissa Whitney, CMA,acting as a Neurosurgeon for [name], FNP.,have documented all relevant documentation on the behalf of [name], FNP,as directed by  [name], FNP while in the presence of [name], FNP.  Subjective:  Patient ID: Melissa Whitney , female    DOB: 1980-11-19 , 43 y.o.   MRN: [id number]  Chief Complaint  Patient presents with   Hypothyroidism    Patient presents today for a hypothyroidism follow up, Patient reports compliance with medication. Patient denies any chest pain, SOB, or headaches. Patient has no concerns today.    Discussed the use of AI scribe software for clinical note transcription with the patient, who gave verbal consent to proceed.  History of Present Illness Melissa Whitney is a 43 year old female with PCOS and hypothyroidism who presents for follow-up and medication management.  She has a history of polycystic ovary syndrome (PCOS), which has impacted her fertility. She requires medication to ovulate and describes her condition as 'sort of like reverse birth control.' She and her husband have decided not to try for another child at this time, as she has a two and a 23 year old daughter.  She experienced a miscarriage at eleven weeks, with the fetus measuring six weeks, and underwent a dilation and curettage (D&C) procedure. She has stopped bleeding and has had a normal menstrual cycle since the procedure.  She is currently taking levothyroxine  112 mcg daily for hypothyroidism. She has had previous non-compliance with medication timing, which affected her thyroid levels. She has not experienced symptoms of thyroid dysfunction recently.  She is also taking Vraylar, prescribed by her psychiatrist for suspected bipolar II disorder, and reports that it is working well. She is in the experimental stage of this treatment.  She has been using semaglutide, obtained from a website selling research chemicals, for weight management and has lost 40  pounds. She acknowledges the unconventional source of the medication but notes its effectiveness.  She mentions a mole on her face that appeared during her first pregnancy and has not changed since her miscarriage. She wants to have it removed.   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 turner ; age 76 had tilt table for hx multiple syncopal episodes  w/ seizure activity,  negative neurology work-up;  03-14-2000  +  vasovagal syncope w/ pause asystole 8.5 seconds , recommended SSRI or possible PPM, pt started taking paxil daily ;  Tilt table done again 04-03-2000 negative study on paxil,  pt did not need PPM   Hypertension    Only during pregnancy   Hypothyroidism    followed by pcp   Migraines    PCOS (polycystic ovarian syndrome)    Retained products of conception after miscarriage 07/2023   Wears glasses  Family History  Problem Relation Age of Onset   Thyroid disease Mother    Endometriosis Mother    COPD Mother    Depression Mother    Heart disease Father    Heart attack Father    Depression Father    Diabetes Father    Hypertension Father    Arthritis Sister        psoriatic   Thyroid disease Sister    Endometriosis Sister    Thyroid disease Maternal Grandmother    Heart disease Maternal Grandmother    Esophageal cancer Paternal Grandmother      Current Outpatient Medications:     cyclobenzaprine (FLEXERIL) 5 MG tablet, Take 5 mg by mouth as needed for muscle spasms., Disp: , Rfl:    OVER THE COUNTER MEDICATION, Take 1 each by mouth at bedtime as needed (sleep). THC gummy, Disp: , Rfl:    SEMAGLUTIDE-WEIGHT MANAGEMENT [place], Inject 5 mg into the skin once a week. Monday's, Disp: , Rfl:    VRAYLAR 1.5 MG capsule, Take 1.5 mg by mouth daily., Disp: , Rfl:    levothyroxine  (SYNTHROID ) 112 MCG tablet, Take 1 tablet (112 mcg total) by mouth daily before breakfast., Disp: 90 tablet, Rfl: 1   Allergies  Allergen Reactions   Lamictal [Lamotrigine] Other (See Comments)    Caused right eye toxic maculopathy   Other Itching and Swelling    Poppy Seed     Review of Systems  Constitutional: Negative.  Negative for appetite change and fatigue.  Respiratory: Negative.    Cardiovascular: Negative.   Endocrine: Negative for cold intolerance and heat intolerance.  Skin:        She has a mole to right side of her face which appeared with her first pregnancy and increased in size with her second pregnancy  Neurological: Negative.  Negative for dizziness, light-headedness and headaches.  Psychiatric/Behavioral:  Negative for behavioral problems and dysphoric mood. The patient is not nervous/anxious.      Today's Vitals   11/16/23 0955  BP: 130/80  Pulse: 78  Temp: 98.5 F (36.9 C)  TempSrc: Oral  Weight: 205 lb (93 kg)  Height: 5' 7 (1.702 m)  PainSc: 0-No pain   Body mass index is 32.11 kg/m.  Wt Readings from Last 3 Encounters:  11/16/23 205 lb (93 kg)  09/26/23 206 lb 12.8 oz (93.8 kg)  09/08/23 209 lb (94.8 kg)     Objective:  Physical Exam Vitals and nursing note reviewed.  Constitutional:      Appearance: Normal appearance. She is well-developed.  HENT:     Head: Normocephalic and atraumatic.  Eyes:     Pupils: Pupils are equal, round, and reactive to light.  Cardiovascular:     Rate and Rhythm: Normal rate and regular rhythm.     Pulses: Normal pulses.      Heart sounds: Normal heart sounds. No murmur heard. Pulmonary:     Effort: Pulmonary effort is normal. No respiratory distress.     Breath sounds: Normal breath sounds.  Musculoskeletal:        General: Normal range of motion.  Skin:    General: Skin is warm and dry.     Capillary Refill: Capillary refill takes less than 2 seconds.     Findings: Lesion (nevi flesh colored to her right cheek) present.  Neurological:     General: No focal deficit present.     Mental Status: She is alert and oriented to person, place, and time.  Cranial Nerves: No cranial nerve deficit.     Motor: No weakness.  Psychiatric:        Mood and Affect: Mood normal.        Behavior: Behavior normal.        Thought Content: Thought content normal.        Judgment: Judgment normal.        Assessment And Plan:  Acquired hypothyroidism Assessment & Plan: Managed with levothyroxine  112 mcg daily. No current symptoms. Thyroid function needs re-evaluation. - Order thyroid function tests. - Refill levothyroxine  prescription.  Orders: -     TSH + free T4 -     Levothyroxine  Sodium; Take 1 tablet (112 mcg total) by mouth daily before breakfast.  Dispense: 90 tablet; Refill: 1  Atypical nevi Assessment & Plan: Mole on face since first pregnancy, regular but slightly rough.  - Refer to dermatologist for evaluation and possible removal. - Photograph mole for medical record taken  Orders: -     Ambulatory referral to Dermatology  Influenza vaccination declined  Class 1 obesity due to excess calories with body mass index (BMI) of 32.0 to 32.9 in adult, unspecified whether serious comorbidity present Assessment & Plan: She has lost approximately 40 lbs.    Encounter for screening -     Hepatitis B surface antibody,qualitative    Return in about 6 months (around 05/16/2024) for phy when able. .  Patient was given opportunity to ask questions. Patient verbalized understanding of the plan and was  able to repeat key elements of the plan. All questions were answered to their satisfaction.    Melissa [name], FNP, have reviewed all documentation for this visit. The documentation on 11/16/23 for the exam, diagnosis, procedures, and orders are all accurate and complete.   IF YOU HAVE BEEN REFERRED TO A SPECIALIST, IT MAY TAKE 1-2 WEEKS TO SCHEDULE/PROCESS THE REFERRAL. IF YOU HAVE NOT HEARD FROM US /SPECIALIST IN TWO WEEKS, PLEASE GIVE US  A CALL AT [phone].

## 2023-11-16 NOTE — Assessment & Plan Note (Signed)
 She has lost approximately 40 lbs.

## 2023-11-17 LAB — TSH+FREE T4
Free T4: 1.38 ng/dL (ref 0.82–1.77)
TSH: 1.44 u[IU]/mL (ref 0.450–4.500)

## 2023-11-17 LAB — HEPATITIS B SURFACE ANTIBODY,QUALITATIVE: Hep B Surface Ab, Qual: NONREACTIVE

## 2023-11-21 ENCOUNTER — Ambulatory Visit: Admitting: Physician Assistant

## 2023-11-21 ENCOUNTER — Encounter: Payer: Self-pay | Admitting: Physician Assistant

## 2023-11-21 VITALS — BP 121/81 | HR 77

## 2023-11-21 DIAGNOSIS — L820 Inflamed seborrheic keratosis: Secondary | ICD-10-CM | POA: Diagnosis not present

## 2023-11-21 NOTE — Progress Notes (Signed)
   New Patient Visit   Subjective  Melissa Whitney is a 43 y.o. female NEW PATIENT who presents for the following: skin lesion on right cheek.   Patient states she has mole located at the right lateral face that she would like to have examined. Patient reports the areas have been there for 2 years. Itchy and irritated.   The following portions of the chart were reviewed this encounter and updated as appropriate: medications, allergies, medical history  Review of Systems:  No other skin or systemic complaints except as noted in HPI or Assessment and Plan.  Objective  Well appearing patient in no apparent distress; mood and affect are within normal limits.   A focused examination was performed of the following areas: right lateral face  Relevant exam findings are noted in the Assessment and Plan.          Assessment & Plan   INFLAMED SEBORRHEIC KERATOSIS Exam: Erythematous keratotic or waxy stuck-on papule or plaque.  Symptomatic, irritating, patient would like treated.  Benign-appearing.  Call clinic for new or changing lesions.   Prior to procedure, discussed risks of blister formation, small wound, skin dyspigmentation, or rare scar following treatment. Recommend Vaseline ointment to treated areas while healing.  Destruction Procedure Note Destruction method: cryotherapy   Informed consent: discussed and consent obtained   Lesion destroyed using liquid nitrogen: Yes   Outcome: patient tolerated procedure well with no complications   Post-procedure details: wound care instructions given   Locations: right lateral cheek  # of Lesions Treated: 1   INFLAMED SEBORRHEIC KERATOSIS Right Buccal Cheek Destruction of lesion - Right Buccal Cheek Complexity: simple   Destruction method: cryotherapy   Informed consent: discussed and consent obtained   Timeout:  patient name, date of birth, surgical site, and procedure verified Lesion destroyed using liquid nitrogen: Yes    Region frozen until ice ball extended beyond lesion: Yes   Outcome: patient tolerated procedure well with no complications   Post-procedure details: wound care instructions given     Return if symptoms worsen or fail to improve.  I, Doyce Pan, CMA, am acting as scribe for Kamari Bilek K, PA-C.   Documentation: I have reviewed the above documentation for accuracy and completeness, and I agree with the above.  Tagan Bartram K, PA-C

## 2023-11-21 NOTE — Patient Instructions (Signed)

## 2023-12-05 ENCOUNTER — Encounter: Payer: Self-pay | Admitting: Nurse Practitioner

## 2023-12-08 ENCOUNTER — Other Ambulatory Visit: Payer: Self-pay | Admitting: Nurse Practitioner

## 2023-12-08 DIAGNOSIS — R1013 Epigastric pain: Secondary | ICD-10-CM

## 2023-12-08 MED ORDER — PANTOPRAZOLE SODIUM 40 MG PO TBEC: 40.0000 mg | DELAYED_RELEASE_TABLET | Freq: Every day | ORAL | 1 refills | Status: AC

## 2023-12-12 ENCOUNTER — Other Ambulatory Visit: Payer: Self-pay

## 2023-12-12 DIAGNOSIS — E039 Hypothyroidism, unspecified: Secondary | ICD-10-CM

## 2023-12-12 MED ORDER — LEVOTHYROXINE SODIUM 112 MCG PO TABS: 112.0000 ug | ORAL_TABLET | Freq: Every day | ORAL | 1 refills | Status: AC

## 2024-05-16 ENCOUNTER — Encounter: Payer: Self-pay | Admitting: Nurse Practitioner
